# Patient Record
Sex: Female | Born: 1980 | Race: Black or African American | Hispanic: No | Marital: Single
Health system: Southern US, Community
[De-identification: ages and names within clinical notes are randomized; demographics above are authoritative.]

## PROBLEM LIST (undated history)

## (undated) DIAGNOSIS — F319 Bipolar disorder, unspecified: Secondary | ICD-10-CM

## (undated) HISTORY — DX: Bipolar disorder, unspecified: F31.9

---
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
## 2013-04-25 ENCOUNTER — Emergency Department: Payer: Self-pay | Admitting: Emergency Medicine

## 2013-06-28 ENCOUNTER — Emergency Department: Payer: Self-pay | Admitting: Emergency Medicine

## 2013-06-28 LAB — COMPREHENSIVE METABOLIC PANEL
ALT: 19 U/L (ref 12–78)
Albumin: 3.6 g/dL (ref 3.4–5.0)
Alkaline Phosphatase: 80 U/L
Anion Gap: 3 — ABNORMAL LOW (ref 7–16)
BUN: 14 mg/dL (ref 7–18)
Bilirubin,Total: 0.2 mg/dL (ref 0.2–1.0)
CHLORIDE: 104 mmol/L (ref 98–107)
CO2: 26 mmol/L (ref 21–32)
Calcium, Total: 9.9 mg/dL (ref 8.5–10.1)
Creatinine: 0.91 mg/dL (ref 0.60–1.30)
EGFR (African American): >60
EGFR (Non-African Amer.): >60
Glucose: 104 mg/dL — ABNORMAL HIGH (ref 65–99)
OSMOLALITY: 267 (ref 275–301)
Potassium: 4.5 mmol/L (ref 3.5–5.1)
SGOT(AST): 25 U/L (ref 15–37)
Sodium: 133 mmol/L — ABNORMAL LOW (ref 136–145)
TOTAL PROTEIN: 8.1 g/dL (ref 6.4–8.2)

## 2013-06-28 LAB — URINALYSIS, COMPLETE
BLOOD: NEGATIVE
Bilirubin,UR: NEGATIVE
Glucose,UR: NEGATIVE mg/dL (ref 0–75)
KETONE: NEGATIVE
LEUKOCYTE ESTERASE: NEGATIVE
Nitrite: NEGATIVE
PH: 7 (ref 4.5–8.0)
PROTEIN: NEGATIVE
Specific Gravity: 1.009 (ref 1.003–1.030)
WBC UR: 1 /HPF (ref 0–5)

## 2013-06-28 LAB — DRUG SCREEN, URINE

## 2013-06-28 LAB — CBC
HCT: 38 % (ref 35.0–47.0)
HGB: 12.9 g/dL (ref 12.0–16.0)
MCH: 29.3 pg (ref 26.0–34.0)
MCHC: 33.8 g/dL (ref 32.0–36.0)
MCV: 86 fL (ref 80–100)
Platelet: 282 10*3/uL (ref 150–440)
RBC: 4.4 10*6/uL (ref 3.80–5.20)
RDW: 16.6 % — ABNORMAL HIGH (ref 11.5–14.5)
WBC: 5.3 10*3/uL (ref 3.6–11.0)

## 2013-06-28 LAB — ETHANOL: Ethanol %: <0.003 % (ref 0.000–0.080)

## 2013-06-28 LAB — ACETAMINOPHEN LEVEL: Acetaminophen: <2 ug/mL

## 2013-06-28 LAB — SALICYLATE LEVEL: Salicylates, Serum: <1.7 mg/dL
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
## 2014-10-08 NOTE — Consult Note (Signed)
PATIENT NAME:  Monica Costa, Tameria MR#:  [id number] DATE OF BIRTH:  1981/01/28  DATE OF CONSULTATION:  06/29/2013  REFERRING PHYSICIAN:  Kathreen DevoidKevin A. Paduchowski, MD CONSULTING PHYSICIAN:  Ardeen FillersUzma S. Garnetta BuddyFaheem, MD  REASON FOR CONSULTATION: "I'm in a lot of pain."   HISTORY OF PRESENT ILLNESS: The patient is a 34 year old African American female who presented to the ED complaining of hurting all over. She reported that she is having headaches, "My eyes burn and my feet are freezing." She stated that she cannot take the pain and cannot deal with it anymore.   During my interview, the patient was noted to be sitting comfortably in the bed. She reported that she is currently living at the shelter since December 14th. Reported that she was having a conflict with the staff at the shelter and, since she was Caucasian lady, she asked her to leave. The patient reported that she feels that she is having some confusion and that her mind is not under her control. She reported that she never had a mental health assessment done in the past and she might be bipolar, however, she has never taken any medications. She reported that she wants to work and she wants to apply for different jobs. Reported that she was in seen at [facility] and they have prescribed her medications including naproxen, Fioricet and Percocet, but her main 2 medications are naproxen and Flexeril. Reported that she was last seen there in October and she has not taken any medications for the past couple of months. Reported that she is not having any thoughts to harm herself. She currently denied having any depressive symptoms. She denied having any suicidal or homicidal ideations or plans. 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 The patient appeared to be somewhat anxious and angry at the people at the shelter. However, she reported that she does not have any issues and  she can go back at the same place. She reported that she is not taking any medications on a consistent basis.   PAST PSYCHIATRIC HISTORY: The patient reported that she does not have any history of mental illness and has never tried to hurt herself. Reported that she has never been evaluated by a psychiatrist in the past. She does not take any psychotropic medications. Reported that she has only been seen by the [facility] and different area hospitals including in [place] well as in [place] and in [place]. She denies having any current issues. She reported that she has been is living in the shelter since December. Reported that before that she was living with her family members. She has never attempted suicide.   SUBSTANCE ABUSE HISTORY: The patient denied using any drugs or alcohol at this time. She stated that she does not smoke   ALLERGIES: No known drug allergies.   CURRENT MEDICATIONS: None.   VITAL SIGNS: Temperature 98.4, pulse 78, respirations 18, blood pressure 131/71.   REVIEW OF SYSTEMS: CONSTITUTIONAL: The patient denies any fever or chills. No weight changes.  EYES: She initially complained of burning in her eyes but currently she denied having any burning or irritation.  RESPIRATORY: No shortness of breath or cough.  CARDIOVASCULAR: Denies any chest pain or orthopnea.  GASTROINTESTINAL: No abdominal pain, nausea, vomiting or diarrhea.  GENITOURINARY: No incontinence or frequency.  ENDOCRINE: No heat or cold intolerance.  LYMPHATIC: No anemia or easy bruising.  INTEGUMENTARY: No acne or rash.  MUSCULOSKELETAL: Denies any muscle or joint pain.   MENTAL STATUS EXAMINATION: The patient is a moderately-built female who appeared her stated age. She was somewhat anxious. Her mood was fine. Affect was anxious. Thought process was circumstantial. Thought content was nondelusional. She currently denied having any suicidal or homicidal ideations or plans. She denied having any perceptual  disturbances.   DIAGNOSTIC IMPRESSION: AXIS I: Mood disorder.  AXIS II: None.  AXIS III: History of pain.   TREATMENT PLAN:  The patient came to the ED on a voluntary basis as she was having some pain, but she currently denied having any acute symptoms. She reported that she can go back to the shelter and does not need any psychotropic medications. She was also evaluated by behavioral health staff and they also agreed that the patient does not need any medications at this time. She will be referred to the [facility] for continuity of care. I advised the patient to come back if she noticed worsening of her symptoms and she demonstrated understanding. No medications will be dispensed to the patient at this time.   Thank you for allowing me to participate in the care of this patient.   ____________________________ Ardeen Fillers. Garnetta Buddy, MD usf:cs D: 06/29/2013 13:57:19 ET T: 06/29/2013 14:10:00 ET JOB#: [id number]  cc: Ardeen Fillers. Garnetta Buddy, MD, <Dictator> Rhunette Croft MD ELECTRONICALLY SIGNED 07/01/2013 9:07

## 2020-02-13 ENCOUNTER — Emergency Department
Admission: EM | Admit: 2020-02-13 | Discharge: 2020-02-13 | Disposition: A | Discharge status: Other Acute Inpatient Hospital | Payer: Medicare Other | Attending: Emergency Medicine | Admitting: Emergency Medicine

## 2020-02-13 DIAGNOSIS — F3010 Manic episode without psychotic symptoms, unspecified: Secondary | ICD-10-CM

## 2020-02-13 DIAGNOSIS — Z008 Encounter for other general examination: Secondary | ICD-10-CM

## 2020-02-13 DIAGNOSIS — Z20822 Contact with and (suspected) exposure to covid-19: Secondary | ICD-10-CM | POA: Insufficient documentation

## 2020-02-13 DIAGNOSIS — F23 Brief psychotic disorder: Secondary | ICD-10-CM | POA: Insufficient documentation

## 2020-02-13 DIAGNOSIS — F309 Manic episode, unspecified: Secondary | ICD-10-CM | POA: Insufficient documentation

## 2020-02-13 LAB — CBC AND DIFFERENTIAL
Absolute NRBC: 0 10*3/uL (ref 0.00–0.00)
Basophils Absolute Automated: 0.03 10*3/uL (ref 0.00–0.08)
Basophils Automated: 0.3 %
Eosinophils Absolute Automated: 0.11 10*3/uL (ref 0.00–0.44)
Eosinophils Automated: 1 %
Hematocrit: 37.4 % (ref 34.7–43.7)
Hgb: 12.7 g/dL (ref 11.4–14.8)
Immature Granulocytes Absolute: 0.05 10*3/uL (ref 0.00–0.07)
Immature Granulocytes: 0.5 %
Lymphocytes Absolute Automated: 2.26 10*3/uL (ref 0.42–3.22)
Lymphocytes Automated: 20.8 %
MCH: 29.4 pg (ref 25.1–33.5)
MCHC: 34 g/dL (ref 31.5–35.8)
MCV: 86.6 fL (ref 78.0–96.0)
MPV: 10.7 fL (ref 8.9–12.5)
Monocytes Absolute Automated: 0.58 10*3/uL (ref 0.21–0.85)
Monocytes: 5.3 %
Neutrophils Absolute: 7.85 10*3/uL — ABNORMAL HIGH (ref 1.10–6.33)
Neutrophils: 72.1 %
Nucleated RBC: 0 /100 WBC (ref 0.0–0.0)
Platelets: 378 10*3/uL — ABNORMAL HIGH (ref 142–346)
RBC: 4.32 10*6/uL (ref 3.90–5.10)
RDW: 16 % — ABNORMAL HIGH (ref 11–15)
WBC: 10.88 10*3/uL — ABNORMAL HIGH (ref 3.10–9.50)

## 2020-02-13 LAB — COMPREHENSIVE METABOLIC PANEL
ALT: 10 U/L (ref 0–55)
AST (SGOT): 14 U/L (ref 5–34)
Albumin/Globulin Ratio: 0.8 — ABNORMAL LOW (ref 0.9–2.2)
Albumin: 3.1 g/dL — ABNORMAL LOW (ref 3.5–5.0)
Alkaline Phosphatase: 70 U/L (ref 37–106)
Anion Gap: 13 (ref 5.0–15.0)
BUN: 8 mg/dL (ref 7.0–19.0)
Bilirubin, Total: 0.3 mg/dL (ref 0.2–1.2)
CO2: 19 mEq/L — ABNORMAL LOW (ref 22–29)
Calcium: 8.9 mg/dL (ref 8.5–10.5)
Chloride: 106 mEq/L (ref 100–111)
Creatinine: 1 mg/dL (ref 0.6–1.0)
Globulin: 3.9 g/dL — ABNORMAL HIGH (ref 2.0–3.6)
Glucose: 146 mg/dL — ABNORMAL HIGH (ref 70–100)
Potassium: 3.4 mEq/L — ABNORMAL LOW (ref 3.5–5.1)
Protein, Total: 7 g/dL (ref 6.0–8.3)
Sodium: 138 mEq/L (ref 136–145)

## 2020-02-13 LAB — HEMOLYSIS INDEX: Hemolysis Index: 0 (ref 0–18)

## 2020-02-13 LAB — ACETAMINOPHEN LEVEL: Acetaminophen Level: <7 ug/mL — ABNORMAL LOW (ref 10–30)

## 2020-02-13 LAB — ECG 12-LEAD
Atrial Rate: 115 {beats}/min
P Axis: 39 degrees
P-R Interval: 138 ms
Q-T Interval: 312 ms
QRS Duration: 60 ms
QTC Calculation (Bezet): 431 ms
R Axis: 9 degrees
T Axis: 8 degrees
Ventricular Rate: 115 {beats}/min

## 2020-02-13 LAB — SALICYLATE LEVEL: Salicylate Level: <5 mg/dL — ABNORMAL LOW (ref 15.0–30.0)

## 2020-02-13 LAB — COVID-19 (SARS-COV-2): SARS CoV 2 Overall Result: NEGATIVE

## 2020-02-13 LAB — URINALYSIS REFLEX TO MICROSCOPIC EXAM - REFLEX TO CULTURE
Bilirubin, UA: NEGATIVE
Blood, UA: NEGATIVE
Glucose, UA: NEGATIVE
Ketones UA: NEGATIVE
Leukocyte Esterase, UA: NEGATIVE
Nitrite, UA: NEGATIVE
Protein, UR: NEGATIVE
Specific Gravity UA: 1.011 (ref 1.001–1.035)
Urine pH: 6 (ref 5.0–8.0)
Urobilinogen, UA: NEGATIVE mg/dL (ref 0.2–2.0)

## 2020-02-13 LAB — RAPID DRUG SCREEN, URINE
Barbiturate Screen, UR: NEGATIVE
Benzodiazepine Screen, UR: NEGATIVE
Cannabinoid Screen, UR: POSITIVE — AB
Cocaine, UR: NEGATIVE
Opiate Screen, UR: NEGATIVE
PCP Screen, UR: NEGATIVE
Urine Amphetamine Screen: NEGATIVE

## 2020-02-13 LAB — HCG QUANTITATIVE: hCG, Quant.: <1.2

## 2020-02-13 LAB — GFR: EGFR: >60

## 2020-02-13 LAB — ETHANOL: Alcohol: NOT DETECTED mg/dL

## 2020-02-13 MED ORDER — LORAZEPAM 2 MG/ML IJ SOLN
4.0000 mg | Freq: Once | INTRAMUSCULAR | Status: AC
Start: 2020-02-13 — End: 2020-02-13
  Administered 2020-02-13: 02:00:00 4 mg via INTRAMUSCULAR
  Filled 2020-02-13: qty 2

## 2020-02-13 MED ORDER — POTASSIUM CHLORIDE CRYS ER 20 MEQ PO TBCR
40.0000 meq | EXTENDED_RELEASE_TABLET | Freq: Once | ORAL | Status: DC
Start: 2020-02-13 — End: 2020-02-13

## 2020-02-13 MED ORDER — HALOPERIDOL LACTATE 5 MG/ML IJ SOLN
10.0000 mg | Freq: Once | INTRAMUSCULAR | Status: AC
Start: 2020-02-13 — End: 2020-02-13
  Administered 2020-02-13: 02:00:00 10 mg via INTRAMUSCULAR
  Filled 2020-02-13: qty 2

## 2020-02-13 MED ORDER — DIPHENHYDRAMINE HCL 50 MG/ML IJ SOLN
50.0000 mg | Freq: Once | INTRAMUSCULAR | Status: AC
Start: 2020-02-13 — End: 2020-02-13
  Administered 2020-02-13: 02:00:00 50 mg via INTRAMUSCULAR
  Filled 2020-02-13: qty 1

## 2020-02-13 MED ORDER — KETAMINE HCL 50 MG/ML IJ SOLN
2.0000 mg/kg | Freq: Once | INTRAMUSCULAR | Status: DC | PRN
Start: 2020-02-13 — End: 2020-02-13

## 2020-02-13 NOTE — ED Notes (Signed)
Patient is resting comfortably. 
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
## 2020-02-13 NOTE — ED Notes (Addendum)
This RN explained plan of care to patient. IM medications administered, no manual restraining needed, patient appearing compliant at this time.

## 2020-02-13 NOTE — ED Provider Notes (Signed)
EMERGENCY DEPARTMENT HISTORY AND PHYSICAL EXAM     Physician/Midlevel provider first contact with patient: 02/13/20 0118         Date: 02/13/2020  Patient Name: Donna Kline    This note was generated by the Epic EMR system/ Dragon speech recognition and may contain inherent errors or omissions not intended by the user. Grammatical errors, random word insertions, deletions and pronoun errors  are occasional consequences of this technology due to software limitations. Not all errors are caught or corrected. If there are questions or concerns about the content of this note or information contained within the body of this dictation they should be addressed directly with the author for clarification.    History of Presenting Illness     Chief Complaint   Patient presents with    Psychiatric Evaluation       History Provided By: Patient, EMS and [employer]    Chief Complaint: Psychotic behavior  Onset: This evening  Timing: Constant  Location: Psychiatric  Quality: Manic  Radiation: none  Severity: Moderate to severe  Exacerbating factors: None  Alleviating factors: None  Associated Symptoms: Agitation  Pertinent Negatives: see ROS    Additional History: Donna Kline is a 39 y.o. female with history of bipolar disorder BIBA due to psychotic behavior in the field.  EMS was called by family, who reported manic behavior this evening.  Patient was aggressive with EMS and police and claimed to be a deity in the field.  She received ketamine 100 mg IM and Haldol 5 mg IM PTA.  On arrival patient awake but only intermittently cooperative.  No further information is available at this time.  Patient arrives under ECO.    I reviewed patient's last ED visit, clinic visit or admission/discharge summary, as well as associated recent EKGs, lab or imaging results, if applicable.     PCP: No primary care provider on file.    Past History   Past Medical History--reviewed, as per HPI  Past Surgical History--reviewed, noncontributory  Family  History--reviewed, noncontributory  Social History--reviewed, noncontributory  Allergies reviewed and documented as pertinent to the case.     Review of Systems     Review of Systems   Constitutional: Negative for chills and fever.   HENT: Negative for congestion, sore throat and voice change.    Eyes: Negative for redness and visual disturbance.   Respiratory: Negative for cough, chest tightness and shortness of breath.    Cardiovascular: Negative for chest pain, palpitations and leg swelling.   Gastrointestinal: Negative for abdominal pain, nausea and vomiting.   Endocrine: Negative for polydipsia and polyphagia.   Genitourinary: Negative for dysuria, flank pain, frequency, urgency and vaginal bleeding.   Musculoskeletal: Negative for back pain, gait problem, neck pain and neck stiffness.   Skin: Negative for color change and rash.   Allergic/Immunologic: Negative for immunocompromised state.   Neurological: Negative for dizziness, syncope, weakness, numbness and headaches.   Hematological: Negative for adenopathy.   Psychiatric/Behavioral: Positive for agitation and behavioral problems. Negative for confusion, hallucinations and suicidal ideas. The patient is not nervous/anxious.         + Manic behavior   All other systems reviewed and are negative.      Other than pertinent positives as above, a complete ROS was reviewed and negative.    Physical Exam   BP 166/77    Pulse 96    Temp 98.6 F (37 C) (Oral)    Resp 20    Ht 5'  2" (1.575 m)    Wt 113.4 kg    SpO2 98%    BMI 45.73 kg/m     Physical Exam  Vitals and nursing note reviewed.   Constitutional:       Appearance: She is well-developed.      Comments: Awake and alert.  Intermittently agitated, sometimes redirectable.  BMI> 45.   HENT:      Head: Normocephalic and atraumatic.      Nose: Nose normal.   Eyes:      Conjunctiva/sclera: Conjunctivae normal.      Pupils: Pupils are equal, round, and reactive to light.   Neck:      Vascular: No JVD.    Cardiovascular:      Rate and Rhythm: Normal rate and regular rhythm.      Pulses: Normal pulses.      Heart sounds: Normal heart sounds.      Comments: Regular pulses, palpable in all 4 extremities. No pedal edema.   Pulmonary:      Effort: Pulmonary effort is normal. No respiratory distress.      Breath sounds: Normal breath sounds.      Comments: Normal work of breathing, clear lungs, no tachypnea, no respiratory distress.  Abdominal:      General: Bowel sounds are normal. There is no distension.      Palpations: Abdomen is soft.      Tenderness: There is no abdominal tenderness.      Comments: Abdomen soft, NTND, NABS. No guarding, rebound or rigidity. No palpable masses or hernias.    Musculoskeletal:         General: No swelling or tenderness. Normal range of motion.      Cervical back: Normal range of motion and neck supple.   Skin:     General: Skin is warm and dry.      Findings: No rash.   Neurological:      General: No focal deficit present.      Mental Status: She is oriented to Kline, place, and time.      Motor: No abnormal muscle tone.      Comments: Awake and alert.  Moves all 4 extremities.  Grossly nonfocal neuro exam.   Psychiatric:      Comments: Intermittently agitated and sometimes redirectable.  Exhibiting manic behavior.           Diagnostic Study Results     Labs -  Reviewed, if relevant to the case.    Radiologic Studies -   Reviewed, if relevant to the case.      Medical Decision Making   I am the first provider for this patient.    I reviewed the vital signs, available nursing notes, past medical history, past surgical history, family history and social history.    Vital Signs: Reviewed the patient's vital signs during ED stay.    I reviewed pt's pulse oxymetry and cardiac monitor values, as relevant to the case.    Pulse Oximetry Analysis: 98% on RA- Normal    Cardiac Monitor:  Rhythm:  Sinus Tachycardia, Rate:  Tachycardic, Ectopy:  None    Personal Protective Equipment  (PPE)  Gloves, N95 and surgical mask.    Record Review: The following, if applicable to the case, were reviewed and noted:    Old medical records.  Nursing notes.  Outside records.     EKG:  Interpreted by this Emergency Physician.   Time Interpreted: 0127   Rate: 115   Rhythm:  Sinus Tachycardia    Interpretation: QRS 60, QTc 431, no ST elevations or depressions   Comparison: No prior study is available for comparison.    Critical Care Time:   CRITICAL CARE: The high probability of sudden, clinically significant deterioration in the patient's condition required the highest level of my preparedness to intervene urgently.    The services I provided to this patient were to treat and/or prevent clinically significant deterioration that could result in: death.  Services included the following: chart data review, reviewing nursing notes and/or old charts, documentation time, consultant collaboration regarding findings and treatment options, medication orders and management, direct patient care, re-evaluations, vital sign assessments and ordering, interpreting and reviewing diagnostic studies/lab tests.    Aggregate critical care time was 37 minutes, which includes only time during which I was engaged in work directly related to the patient's care, as described above, whether at the bedside or elsewhere in the Emergency Department.  It did not include time spent performing other reported procedures or the services of residents, students, nurses or physician assistants.      ED Course:     69: Spoke with [facility], who requested to evaluate patient before we sedate her for I&O to get urine for medical clearance.     0209: [facility] will proceed with TDO.     0340: Patient is medically cleared.     1000: Bed search is still in progress.  Patient signed out to the ED Dr. Dory Peru, who will follow up on the progress of placement for this patient.    Provider Notes/MDM:     39 y.o. female with history of bipolar BIBA and accompanied by [employer]  due to psychotic and manic behavior in the field.  She received Haldol and ketamine PTA in the field due to verbally aggressive behavior towards [employer].  On arrival patient tachycardic and hypertensive, good sats on RA.  Exam as above.  Patient arrived under ECO.  During ED stay patient required chemical restraints in order to accomplish medical clearance (IM Ativan, Benadryl, Haldol and ketamine), after which patient was closely monitored and frequently reassessed.  EKG without ischemic changes.  Labwork notable for UDS positive for cannabis and mild hypokalemia on CMP, otherwise reassuring.  Patient evaluated by [facility] and is now TDO.  Bed search in progress.  Patient signed out to the incoming ED MD, who will follow up on the progress of psychiatric hospital placement.  Patient remains HDS      Diagnosis     Clinical Impression:   1. Manic behavior    2. Acute psychosis        Disposition:   ED Disposition     ED Disposition Condition Date/Time Comment    Transfer to Another Facility  Sun Feb 13, 2020  2:27 AM Donna Kline should be transferred out to a psychiatric facility, location TBD.          The above diagnostic process was due to medical necessity based on risk stratification of potential harm of patient's presenting complaint.     Attestations: This note is prepared by Ardis Hughs, MD, PHD, FACEP.      Juliane Poot, MD PhD  02/13/20 [phone]

## 2020-02-13 NOTE — ED Notes (Signed)
Patient refusing to give urine sample after explantion stating "I am a god, woman, and man, I complete me."

## 2020-02-13 NOTE — ED Notes (Signed)
[employer] at the bedside

## 2020-02-13 NOTE — ED Notes (Addendum)
Patient refusing to get into green gown at this time. Bilateral wrist handcuffs in place.

## 2020-02-13 NOTE — ED Notes (Signed)
Patient cooperative at this time, agrees to try and urinate on bedside commode. Urine collected and sent, patient back in bed, left wrist in cuff.

## 2020-02-13 NOTE — ED Notes (Signed)
Patient refusing to give urine at this time.

## 2020-02-14 ENCOUNTER — Encounter (HOSPITAL_BASED_OUTPATIENT_CLINIC_OR_DEPARTMENT_OTHER): Payer: Self-pay

## 2020-02-14 NOTE — Progress Notes (Signed)
Pt was admitted to [facility] on 8/29 at 1557 under a TDO. Accepting information given directly from CSB to ED.

## 2020-06-27 ENCOUNTER — Emergency Department
Admission: EM | Admit: 2020-06-27 | Discharge: 2020-06-28 | Disposition: A | Discharge status: Other Acute Inpatient Hospital | Payer: Medicare Other | Attending: Emergency Medicine | Admitting: Emergency Medicine

## 2020-06-27 DIAGNOSIS — F29 Unspecified psychosis not due to a substance or known physiological condition: Secondary | ICD-10-CM | POA: Insufficient documentation

## 2020-06-27 DIAGNOSIS — Z20822 Contact with and (suspected) exposure to covid-19: Secondary | ICD-10-CM | POA: Insufficient documentation

## 2020-06-27 DIAGNOSIS — R451 Restlessness and agitation: Secondary | ICD-10-CM | POA: Insufficient documentation

## 2020-06-27 MED ORDER — LORAZEPAM 1 MG PO TABS
1.0000 mg | ORAL_TABLET | Freq: Once | ORAL | Status: AC
Start: 2020-06-27 — End: 2020-06-27
  Administered 2020-06-27: 21:00:00 1 mg via ORAL
  Filled 2020-06-27: qty 1

## 2020-06-27 MED ORDER — ACETAMINOPHEN 500 MG PO TABS
1000.0000 mg | ORAL_TABLET | Freq: Once | ORAL | Status: AC
Start: 2020-06-27 — End: 2020-06-27
  Administered 2020-06-27: 21:00:00 1000 mg via ORAL
  Filled 2020-06-27: qty 2

## 2020-06-27 MED ORDER — HALOPERIDOL LACTATE 5 MG/ML IJ SOLN
5.0000 mg | Freq: Once | INTRAMUSCULAR | Status: AC | PRN
Start: 2020-06-27 — End: 2020-06-28
  Administered 2020-06-28: 07:00:00 5 mg via INTRAMUSCULAR
  Filled 2020-06-27: qty 1
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
## 2020-06-27 NOTE — ED Notes (Signed)
Pt refusing lab work and ekg at this time. Provider aware

## 2020-06-27 NOTE — ED Notes (Signed)
Bed: BL22  Expected date:   Expected time:   Means of arrival:   Comments:  Medic 206

## 2020-06-28 ENCOUNTER — Inpatient Hospital Stay
Admission: AD | Admit: 2020-06-28 | Discharge: 2020-07-05 | DRG: 885 | Disposition: A | Discharge status: Home / Self Care | Payer: PRIVATE HEALTH INSURANCE | Attending: Psychiatry | Admitting: Psychiatry

## 2020-06-28 DIAGNOSIS — F29 Unspecified psychosis not due to a substance or known physiological condition: Secondary | ICD-10-CM | POA: Diagnosis present

## 2020-06-28 DIAGNOSIS — F17210 Nicotine dependence, cigarettes, uncomplicated: Secondary | ICD-10-CM | POA: Diagnosis present

## 2020-06-28 DIAGNOSIS — F17200 Nicotine dependence, unspecified, uncomplicated: Secondary | ICD-10-CM

## 2020-06-28 DIAGNOSIS — F1210 Cannabis abuse, uncomplicated: Secondary | ICD-10-CM | POA: Diagnosis present

## 2020-06-28 DIAGNOSIS — Z008 Encounter for other general examination: Secondary | ICD-10-CM

## 2020-06-28 DIAGNOSIS — F250 Schizoaffective disorder, bipolar type: Principal | ICD-10-CM | POA: Diagnosis present

## 2020-06-28 DIAGNOSIS — I10 Essential (primary) hypertension: Secondary | ICD-10-CM | POA: Diagnosis present

## 2020-06-28 DIAGNOSIS — F1010 Alcohol abuse, uncomplicated: Secondary | ICD-10-CM | POA: Diagnosis present

## 2020-06-28 LAB — CBC AND DIFFERENTIAL
Absolute NRBC: 0 10*3/uL (ref 0.00–0.00)
Basophils Absolute Automated: 0.02 10*3/uL (ref 0.00–0.08)
Basophils Automated: 0.2 %
Eosinophils Absolute Automated: 0.01 10*3/uL (ref 0.00–0.44)
Eosinophils Automated: 0.1 %
Hematocrit: 38.5 % (ref 34.7–43.7)
Hgb: 12.8 g/dL (ref 11.4–14.8)
Immature Granulocytes Absolute: 0.05 10*3/uL (ref 0.00–0.07)
Immature Granulocytes: 0.5 %
Lymphocytes Absolute Automated: 1.57 10*3/uL (ref 0.42–3.22)
Lymphocytes Automated: 15.5 %
MCH: 27.6 pg (ref 25.1–33.5)
MCHC: 33.2 g/dL (ref 31.5–35.8)
MCV: 83.2 fL (ref 78.0–96.0)
MPV: 9.8 fL (ref 8.9–12.5)
Monocytes Absolute Automated: 0.65 10*3/uL (ref 0.21–0.85)
Monocytes: 6.4 %
Neutrophils Absolute: 7.85 10*3/uL — ABNORMAL HIGH (ref 1.10–6.33)
Neutrophils: 77.3 %
Nucleated RBC: 0 /100 WBC (ref 0.0–0.0)
Platelets: 392 10*3/uL — ABNORMAL HIGH (ref 142–346)
RBC: 4.63 10*6/uL (ref 3.90–5.10)
RDW: 16 % — ABNORMAL HIGH (ref 11–15)
WBC: 10.15 10*3/uL — ABNORMAL HIGH (ref 3.10–9.50)

## 2020-06-28 LAB — RAPID DRUG SCREEN, URINE
Barbiturate Screen, UR: NEGATIVE
Benzodiazepine Screen, UR: NEGATIVE
Cannabinoid Screen, UR: POSITIVE — AB
Cocaine, UR: NEGATIVE
Opiate Screen, UR: NEGATIVE
PCP Screen, UR: NEGATIVE
Urine Amphetamine Screen: NEGATIVE

## 2020-06-28 LAB — COMPREHENSIVE METABOLIC PANEL
ALT: 6 U/L (ref 0–55)
AST (SGOT): 16 U/L (ref 5–34)
Albumin/Globulin Ratio: 0.8 — ABNORMAL LOW (ref 0.9–2.2)
Albumin: 3.3 g/dL — ABNORMAL LOW (ref 3.5–5.0)
Alkaline Phosphatase: 73 U/L (ref 37–106)
Anion Gap: 9 (ref 5.0–15.0)
BUN: 3 mg/dL — ABNORMAL LOW (ref 7.0–19.0)
Bilirubin, Total: 0.5 mg/dL (ref 0.2–1.2)
CO2: 23 mEq/L (ref 22–29)
Calcium: 8.9 mg/dL (ref 8.5–10.5)
Chloride: 105 mEq/L (ref 100–111)
Creatinine: 0.8 mg/dL (ref 0.6–1.0)
Globulin: 4 g/dL — ABNORMAL HIGH (ref 2.0–3.6)
Glucose: 111 mg/dL — ABNORMAL HIGH (ref 70–100)
Potassium: 3.9 mEq/L (ref 3.5–5.1)
Protein, Total: 7.3 g/dL (ref 6.0–8.3)
Sodium: 137 mEq/L (ref 136–145)

## 2020-06-28 LAB — URINALYSIS REFLEX TO MICROSCOPIC EXAM - REFLEX TO CULTURE
Bilirubin, UA: NEGATIVE
Glucose, UA: NEGATIVE
Leukocyte Esterase, UA: NEGATIVE
Nitrite, UA: NEGATIVE
Protein, UR: 30 — AB
Specific Gravity UA: 1.012 (ref 1.001–1.035)
Urine pH: 6 (ref 5.0–8.0)
Urobilinogen, UA: NEGATIVE mg/dL (ref 0.2–2.0)

## 2020-06-28 LAB — HCG QUANTITATIVE: hCG, Quant.: <1.2

## 2020-06-28 LAB — ETHANOL: Alcohol: NOT DETECTED mg/dL

## 2020-06-28 LAB — COVID-19 (SARS-COV-2): SARS CoV 2 Overall Result: NEGATIVE

## 2020-06-28 LAB — GFR: EGFR: >60

## 2020-06-28 LAB — SALICYLATE LEVEL: Salicylate Level: <5 mg/dL — ABNORMAL LOW (ref 15.0–30.0)

## 2020-06-28 LAB — ACETAMINOPHEN LEVEL: Acetaminophen Level: <7 ug/mL — ABNORMAL LOW (ref 10–30)

## 2020-06-28 LAB — HEMOLYSIS INDEX: Hemolysis Index: 6 (ref 0–18)

## 2020-06-28 MED ORDER — RISPERIDONE 1 MG PO TABS
2.0000 mg | ORAL_TABLET | Freq: Two times a day (BID) | ORAL | Status: DC
Start: 2020-06-28 — End: 2020-07-01
  Administered 2020-06-28 – 2020-07-01 (×6): 2 mg via ORAL
  Filled 2020-06-28 (×6): qty 2

## 2020-06-28 MED ORDER — CLONAZEPAM 0.5 MG PO TABS
0.5000 mg | ORAL_TABLET | ORAL | Status: DC | PRN
Start: 2020-06-28 — End: 2020-07-05
  Administered 2020-06-28 – 2020-07-04 (×5): 0.5 mg via ORAL
  Filled 2020-06-28 (×5): qty 1

## 2020-06-28 MED ORDER — AMLODIPINE BESYLATE 5 MG PO TABS
5.0000 mg | ORAL_TABLET | Freq: Every day | ORAL | Status: DC
Start: 2020-06-28 — End: 2020-07-01
  Administered 2020-06-28 – 2020-07-01 (×4): 5 mg via ORAL
  Filled 2020-06-28 (×4): qty 1

## 2020-06-28 MED ORDER — OLANZAPINE 10 MG PO TABS
10.0000 mg | ORAL_TABLET | Freq: Two times a day (BID) | ORAL | Status: DC | PRN
Start: 2020-06-28 — End: 2020-07-05

## 2020-06-28 NOTE — ED Notes (Signed)
This RN rounded on patient, patient appears calm, resting comfortably in room. [employer] at bedside.

## 2020-06-28 NOTE — ED Notes (Signed)
Wt spoke with ER regarding Covid test again- test can be forced on pt.

## 2020-06-28 NOTE — Progress Notes (Signed)
Mental Health Therapy Progress Note  Patient has not attended any groups thus far today.  Will continue to encourage group attendance and participation in order to increase positive coping skills and express feelings regarding current situation.

## 2020-06-28 NOTE — ED Notes (Signed)
Pt has Medicare and Medicaid. No auth needed.
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
## 2020-06-28 NOTE — Progress Notes (Signed)
Worker scanned in the TDO paperwork into Media.

## 2020-06-28 NOTE — Plan of Care (Signed)
Problem: At risk for harm to others AS EVIDENCED BY...  Goal: Will identify long and short term goals based on individual needs and strengths  Outcome: Progressing  Goal: Will report reduction of thoughts to harm others and no attempts  Outcome: Progressing  Goal: Identifies triggers and protective strategies  Outcome: Progressing  Goal: Verbalizes understanding of medication, benefits, and side effects  Outcome: Progressing  Goal: Identify and participate in supportive program therapies  Outcome: Progressing    Donna Kline was laying in a bed when she was received. She is Anxious, irritable and selectively cooperative. Her speech is pressured and loud, her mood is depressed and affect is constricted. She is delusional but her thought process is logical and coherent. She is A+O x 2; disoriented to Time and situation. Pt gets quickly frustrated and irritable when she was asked assessment questions. She denies SI/HI/AVH/SIB and Pain. Pt is med compliant and she made her needs known. Her BP was elevated and Pt was given scheduled Amlodipine 5 mg and KlonoPin 0.5 mg for anxiety. She also had her scheduled Risperdal 2 mg. Pt is isolative to her room and remained sleeping most of the night. Her gait is stable and can perform ADLs independently. Pt is calm and resting without distress. No aggressive behavior so far and staff will continue to monitor Q 15 min for safety.

## 2020-06-28 NOTE — ED Notes (Signed)
Pt still refusing labs and ekg at this time.

## 2020-06-28 NOTE — ED Notes (Signed)
Wt informed that all labs are in process

## 2020-06-28 NOTE — Nursing Progress Note (Signed)
Checklist instructions: Score the patient once a shift and as needed. Absence of a behavior results in a score of 0. Presence or increase in the display of a behavior results in a score of 1. Maximum score (SUM) possible is 6. If a behavior is at baseline for a patient, e.g. the patient is normally confused, this will result in a score of 0. An increase in confusion will result in a score of 1.      Donna Kline is being assessed under the Broset Violence Checklist for any escalations in potentially violent or harmful behavior.     Date of Admission:                                                                         Date of Assessment: 06/28/20   Assessment Time 0900      Confused 1      Irritable 0      Boisterous 0      Verbal Threats 0      Physical Threats 0      Attacking Objects 0      SUM 1          TW attempted the following interventions based on the patient's sum total score assessed on the BVC: 1= Monitoring.      Should the patient require multiple interventions or fails to engage in de-escalation with staff, please review the plan for managing the care of an escalated patient.  Please do the following:  1.  Immediately notify Security and Nursing Administrative Supervisor/Director to respond  2.  Conduct team huddle to rule out clinical causes for escalated behavior  3.  Review the note authored by the Associate Professor for additional information  4.  Document specific behaviors observed  5.  Consider SAFE Team call for additional resources              The Broset Violence Checklist (BVC) is a 6 item inventory that was designed to assist in the prediction of imminent violent behavior (24 hours perspective) in healthcare and other sectors where workplace violence is acknowledged as a serious problem.   The BVC does not give instructions on what to do or what interventions to provide when a violent episode occurs; it is a took meant to aid in the risk assessment process. Interventions are based on the  individual, the environment, and cultural sensitivity indicators.

## 2020-06-28 NOTE — Progress Notes (Signed)
Attempted admission H&P.  Pt was initially cooperative with physical exam but didn't want to answer health questions.  When listening to her lungs, noted both lungs were clear to auscultation with no abnormal sounds.  When attempted to assess eyes, pt did not follow directions and started rambling and becoming agitated. When attempted to listen to her heart and abd, pt started rambling more and was very disorganized, talking fast and was illogical. Not following directions and starting to become agitated.  Unable to complete the admission H&P.  Discussed with pt's nurse.  She will discuss with psych team regarding medication for psychosis and agitation.  -Clarene Reamer, NP-C

## 2020-06-28 NOTE — ED Notes (Addendum)
Wt spoke with ER via Secure Chat about obtaining Covid swab from pt although she is declining all labs at this point. Covid can be obtained involuntarily.     4: Secure Chat with RN reports no luck with covid swab yet
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
## 2020-06-28 NOTE — ED Notes (Signed)
This RN rounded on patient, patient given breakfast tray and water. Patient alert, awake, appears calm, corporative at this time. VS reassessed.

## 2020-06-28 NOTE — Progress Notes (Addendum)
4098 TW attempted to contact Dr. Suezanne Jacquet [facility] on call). Generic voicemail was left.     1191 TW staffed pt with Dr. Lyman Bishop ([facility] on call) whom accepted the pt into his service. [facility] Robert notified.     4782 TW attempted to update [facility] unit, however was informed by RN Tobi Bastos to call back in 15 minutes.     0820 TW attempted to update [facility] unit again, no answer. [facility] and the ED have not been updated about acceptance.     0830 TW updated Tobi Bastos [facility]).    9562 TW updated Dr. Pattricia Boss and ED RN Delorise Shiner via epic chat.    [phone] [facility]) updated.

## 2020-06-28 NOTE — Progress Notes (Signed)
Checklist instructions: Score the patient once a shift and as needed. Absence of a behavior results in a score of 0. Presence or increase in the display of a behavior results in a score of 1. Maximum score (SUM) possible is 6. If a behavior is at baseline for a patient, e.g. the patient is normally confused, this will result in a score of 0. An increase in confusion will result in a score of 1.      Kristine Westerfield is being assessed under the Broset Violence Checklist for any escalations in potentially violent or harmful behavior.     Date of Admission:                         06/28/20                                                Date of Assessment:    Assessment Time 2000      Confused 0      Irritable 0      Boisterous 0      Verbal Threats 0      Physical Threats 0      Attacking Objects 0      SUM 0          TW attempted the following interventions based on the patient's sum total score assessed on the BVC: 1= Monitoring.    Should the patient require multiple interventions or fails to engage in de-escalation with staff, please review the plan for managing the care of an escalated patient.  Please do the following:  1.  Immediately notify Security and Nursing Administrative Supervisor/Director to respond  2.  Conduct team huddle to rule out clinical causes for escalated behavior  3.  Review the note authored by the Associate Professor for additional information  4.  Document specific behaviors observed  5.  Consider SAFE Team call for additional resources      The Broset Violence Checklist (BVC) is a 6 item inventory that was designed to assist in the prediction of imminent violent behavior (24 hours perspective) in healthcare and other sectors where workplace violence is acknowledged as a serious problem.   The BVC does not give instructions on what to do or what interventions to provide when a violent episode occurs; it is a took meant to aid in the risk assessment process. Interventions are based on the  individual, the environment, and cultural sensitivity indicators.

## 2020-06-28 NOTE — Progress Notes (Signed)
RN says the pt. Is disoriented at this time and can't complete this worker's assessment. Worker will attempt to complete the assessment with the pt. At another time. Her TDO hearing is scheduled for 06/30/2020.

## 2020-06-28 NOTE — ED Notes (Signed)
Informed by [facility] that covid swab could be involuntarily obtained.  During this process pt became verbally aggressive and continued to escalate.  At this time, parameters were met to medicate pt with haldol.  IM haldol given and pt consented to blood draw and ekg.

## 2020-06-29 LAB — LIPID PANEL
Cholesterol / HDL Ratio: 4.8
Cholesterol: 221 mg/dL — ABNORMAL HIGH (ref 0–199)
HDL: 46 mg/dL (ref 40–9999)
LDL Calculated: 157 mg/dL — ABNORMAL HIGH (ref 0–99)
Triglycerides: 92 mg/dL (ref 34–149)
VLDL Calculated: 18 mg/dL (ref 10–40)

## 2020-06-29 LAB — HEMOGLOBIN A1C
Average Estimated Glucose: 105.4 mg/dL
Hemoglobin A1C: 5.3 % (ref 4.6–5.9)

## 2020-06-29 MED ORDER — NICOTINE POLACRILEX 2 MG MT GUM
2.0000 mg | CHEWING_GUM | OROMUCOSAL | Status: DC | PRN
Start: 2020-06-29 — End: 2020-07-05
  Administered 2020-07-01 – 2020-07-05 (×7): 2 mg via ORAL
  Filled 2020-06-29 (×8): qty 1

## 2020-06-29 MED ORDER — NICOTINE 14 MG/24HR TD PT24
1.0000 | MEDICATED_PATCH | Freq: Every day | TRANSDERMAL | Status: DC
Start: 2020-06-29 — End: 2020-07-05
  Filled 2020-06-29 (×5): qty 1
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
## 2020-06-29 NOTE — Progress Notes (Signed)
Patient has attended 0 groups so far today. Will continue to monitor, assess and encourage group attendance.

## 2020-06-29 NOTE — Progress Notes (Signed)
Checklist instructions: Score the patient once a shift and as needed. Absence of a behavior results in a score of 0. Presence or increase in the display of a behavior results in a score of 1. Maximum score (SUM) possible is 6. If a behavior is at baseline for a patient, e.g. the patient is normally confused, this will result in a score of 0. An increase in confusion will result in a score of 1.        Donna Kline is being assessed under the Broset Violence Checklist for any escalations in potentially violent or harmful behavior.             Date of Admission: 06/28/2020                                                                 Date of Assessment: 06/29/2020    Assessment Time 0800         Confused 0         Irritable 0         Boisterous 0         Verbal Threats 0         Physical Threats 0         Attacking Objects 0         SUM 0               TW attempted the following interventions based on the patient's sum total score assessed on the BVC: 1= Monitoring.        Should the patient require multiple interventions or fails to engage in de-escalation with staff, please review the plan for managing the care of an escalated patient.  Please do the following:  1.  Immediately notify Security and Nursing Administrative Supervisor/Director to respond  2.  Conduct team huddle to rule out clinical causes for escalated behavior  3.  Review the note authored by the RN or Team Member for additional information  4.  Document specific behaviors observed  5.  Consider SAFE Team call for additional resources                    The Broset Violence Checklist (BVC) is a 6 item inventory that was designed to assist in the prediction of imminent violent behavior (24 hours perspective) in healthcare and other sectors where workplace violence is acknowledged as a serious problem.   The BVC does not give instructions on what to do or what interventions to provide when a violent episode occurs; it is a took meant to aid in the risk  assessment process. Interventions are based on the individual, the environment, and cultural sensitivity indicators.

## 2020-06-29 NOTE — Plan of Care (Signed)
Problem: At risk for harm to others AS EVIDENCED BY...  Goal: Will report reduction of thoughts to harm others and no attempts  Outcome: Progressing  Goal: Verbalizes understanding of medication, benefits, and side effects  Outcome: Progressing   Patient was alert and oriented x2. She denied SI, HI, AVH, and pain. Patient reported her mood as "fine." Patient was calm and cooperative upon approach however, she was minimally engaged during the assessment. Patient's speech was slow and soft. Patient remained isolative to room and reclusive to self. Patient was able to make her needs known. She was compliant with medication. Will continue to monitor patient for safety.
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
## 2020-06-29 NOTE — H&P (Signed)
ADMISSION HISTORY AND PHYSICAL EXAM    Date Time: 06/29/20 7:28 PM  Patient Name: Donna Kline  Attending Physician: Donnetta Hutching, MD    Assessment:   Donna Kline is a 40 year old female with history of bipolar disorder who presents to the hospital with agitation and bizarre behavior (see psychiatry H&P done 06/28/20 for more detail on HPI). Regarding social history, patient reports that she lives in [place] alone. She works as a Recruitment consultant. She admits to marijuana use and smokes cigarettes regularly for the last 5-10 years. The exact amount patient would not specify. She denies alcohol consumption and she denies being sexually active. Currently patient denies SI/HI/AVH. She has no somatic complaints either. She seeks care at this time.   Plan:   1.) HTN  - BP consistently over 140/90 mmhg.  - Most recent diagnostic tests (ie., CBC, CMP, UA) unremarkable.  - Recommend checking ECG 12 lead and TSH.   - Educated patient on lifestyle changes.   - Continue Norvasc 5 mg PO daily.   - Continue heart healthy diet.  - Continue to monitor VS.  2.) Smoker  - Educated patient on the risks of smoking.   - Advised patient to quit smoking.  - NRT initiated.   3.) Agitation, bizarre behavior  - Patient has history of bipolar 1 disorder.  - PE largely unremarkable and vital signs stable.  - All tests done at admission reviewed and unremarkable.  - Continue plan of care as per psychiatric treatment team.  Past Medical History:     Past Medical History:   Diagnosis Date    Bipolar 1 disorder      Past Surgical History:   No past surgical history on file.    Family History:   No family history on file.    Social History:     Social History     Socioeconomic History    Marital status: Single     Spouse name: Not on file    Number of children: Not on file    Years of education: Not on file    Highest education level: Not on file   Occupational History    Not on file   Tobacco Use    Smoking status: Not on file     Smokeless tobacco: Not on file   Substance and Sexual Activity    Alcohol use: Not on file    Drug use: Not on file    Sexual activity: Not on file   Other Topics Concern    Not on file   Social History Narrative    Not on file     Social Determinants of Health     Financial Resource Strain:     Difficulty of Paying Living Expenses: Not on file   Food Insecurity:     Worried About Running Out of Food in the Last Year: Not on file    Ran Out of Food in the Last Year: Not on file   Transportation Needs:     Lack of Transportation (Medical): Not on file    Lack of Transportation (Non-Medical): Not on file   Physical Activity:     Days of Exercise per Week: Not on file    Minutes of Exercise per Session: Not on file   Stress:     Feeling of Stress : Not on file   Social Connections:     Frequency of Communication with Friends and Family: Not on file  Frequency of Social Gatherings with Friends and Family: Not on file    Attends Religious Services: Not on file    Active Member of Clubs or Organizations: Not on file    Attends Banker Meetings: Not on file    Marital Status: Not on file   Intimate Partner Violence:     Fear of Current or Ex-Partner: Not on file    Emotionally Abused: Not on file    Physically Abused: Not on file    Sexually Abused: Not on file   Housing Stability:     Unable to Pay for Housing in the Last Year: Not on file    Number of Places Lived in the Last Year: Not on file    Unstable Housing in the Last Year: Not on file     Allergies:   No Known Allergies    Medications:     No medications prior to admission.     Review of Systems:   A comprehensive review of systems was:     Constitutional: Patient denies fever, chills, headaches, fatigue.   Neuro: Denies headaches, dizziness, visual disturbances, auditory disturbances, coordination difficulties, muscle weakness, paresthesias.  Psychiatric: Currently denies SI/HI/AVH.   Cardiovascular: Denies chest pain,  extremity swelling, palpitations.  Pulmonary: Denies SOB, wheezing, coughing.  ENT: No ear pain, sinus pain/congestion, or sore throat.   Gastrointestinal: Denies N/V/D, constipation, or abdominal pain.  Genitourinary: Denies hematuria, pyuria, dysuria, urinary frequency, hesitancy.  Gynecologic: Denies abnormal vaginal bleeding or discharge. Currently menstruating. Reports regular menses.   Integumentary: Denies any rash or lesions. No wounds.   Musculoskeletal: Denies any muscle pain. Denies any injury.    Physical Exam:     Vitals:    06/29/20 1514   BP: 139/88   Pulse: (!) 108   Resp:    Temp: 98.2 F (36.8 C)   SpO2: 97%     Intake and Output Summary (Last 24 hours) at Date Time  No intake or output data in the 24 hours ending 06/29/20 1928    General: Patient well nourished. BMI 46.56 kg/m2.   Head: No wounds. Normocephalic.   Eyes: PERRLA. Eyes are anicteric.   ENT: Mucous membranes are moist and intact.   Neck: Trachea midline.   Neuro: A&Ox4. CNll-CNXll examined & WNL. Patellar DTR 2+ bilaterally. Pt demonstrates normal strength. Pt ambulating well. No visible tremor.   Cardiac: Heart regular. Rate WNL. No murmur, rubs, or gallops. Peripheral pulses are full & intact. Cap refill < 3 sec. No LE swelling or redness.   Pulmonary: LCTAB. No signs of SOB or dyspnea.   GI: Normoactive bowel sounds. Abd is soft & non-tender to touch. No distention.   GU: This portion of PE inappropriate as patient denies issues in this system.   OBGYN: ""  SKIN: No rashes, lesions, or wounds noted.   MS: FROM both passively & actively. Pt has 5+ strength. Ambulating well.  Psych: Slightly irritable. Denies SI/HI/AVH.     Labs:     Results     Procedure Component Value Units Date/Time    Lipid panel [[id number]]  (Abnormal) Collected: 06/29/20 0706    Specimen: Blood Updated: 06/29/20 0936     Cholesterol 221 mg/dL      Triglycerides 92 mg/dL      HDL 46 mg/dL      LDL Calculated 440 mg/dL      VLDL Calculated 18 mg/dL       Cholesterol / HDL Ratio 4.8  Hemoglobin A1C [[id number]] Collected: 06/29/20 0706    Specimen: Blood Updated: 06/29/20 0933     Hemoglobin A1C 5.3 %      Average Estimated Glucose 105.4 mg/dL         Signed by: Evorn Gong, FNP-BC

## 2020-06-29 NOTE — Progress Notes (Signed)
Donna Kline is sleeping in a bed with her eyes closed. She slept for 9 hours and no distress noted. Safety is maintained.

## 2020-06-30 LAB — TSH: TSH: 0.34 u[IU]/mL — ABNORMAL LOW (ref 0.35–4.94)

## 2020-06-30 NOTE — Progress Notes (Signed)
Worker spoke to the pt. To see if she would let this worker talk to the [place] since she is involved with the [place]. Pt. Says she wants to go to her hearing first, then she will talk to this worker. Worker will touch base with her after her TDO court hearing.
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
## 2020-06-30 NOTE — Progress Notes (Signed)
MD asked this worker to find out the status of the TDO hearing. Worker spoke to [facility] and the pt. Is a CMA and they will fax the TDO results to this worker to [phone]. RN was alerted about the hearing result.

## 2020-06-30 NOTE — Plan of Care (Signed)
Problem: At risk for harm to others AS EVIDENCED BY...  Goal: Will identify long and short term goals based on individual needs and strengths  Outcome: Not Progressing  Goal: Identifies triggers and protective strategies  Outcome: Not Progressing  Goal: Verbalizes understanding of medication, benefits, and side effects  Outcome: Not Progressing  Goal: Identify and participate in supportive program therapies  Outcome: Not Progressing  Goal: Completes discharge safety and recovery plan  Outcome: Not Progressing     Problem: Discharge Barriers  Goal: Patient will be discharged home or other facility with appropriate resources  Outcome: Not Progressing     Problem: At risk for harm to others AS EVIDENCED BY...  Goal: Will report reduction of thoughts to harm others and no attempts  Outcome: Progressing     Problem: Safety  Goal: Patient will be free from injury during hospitalization  Outcome: Progressing  Goal: Patient will be free from infection during hospitalization  Outcome: Progressing     Problem: Pain  Goal: Pain at adequate level as identified by patient  Outcome: Progressing     Problem: Side Effects from Pain Analgesia  Goal: Patient will experience minimal side effects of analgesic therapy  Outcome: Progressing     Problem: Psychosocial and Spiritual Needs  Goal: Demonstrates ability to cope with hospitalization/illness  Outcome: Progressing   Pt was calm, pleasant and cooperative upon approach. Patient was A&Ox2. She denied SI/HI/AVH and pain. She is med compliant and required no PRNs.She is dismissive when asked questions, but in a polite manner, actually proving to be very corteous. Patient's speech was slow and soft. Patient remained isolative to room for the entire shift. She gave the appearance of being depressed, presenting with a flat affect and speaking in a soft, slow voice. When staff spoke to her about why she was here she was evasive with the reasons and never came around to answering the  question. Pt was able to make needs known. Pt was observed resting with eyes closed with unlabored breathing for 8 hours. Staff will continue to monitor for safety.
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
## 2020-06-30 NOTE — Plan of Care (Signed)
Problem: Psychosocial and Spiritual Needs  Goal: Demonstrates ability to cope with hospitalization/illness  Outcome: Progressing     Upon entering her room this morning, Donna Kline was awake lying in bed.  She was not receptive to greeting and introduction from Charity fundraiser.  She is A+O to Person, Place, and Time.  Her speech was loud, hyper-verbal, and pressured.  It was a challenge for RN to get a word in edgewise.  Her thought process was disorganized.  RN inquired about her elevated BP and she immediately started rambling about her chronic pain all over her body citing that that is the reason her BP is high.  She went on to further say, "I'm tired of having to repeat myself all the time.  Don't any of you talk to each other?  Look in my chart."  When RN asked how she slept last night, she angrily stated, "What do you think?  I was in pain all night."  She did, however, deny SI/HI/AVH or thoughts of wanting to harm herself.  She accepted her 0900 dose of Risperdal 2 MG PO.  She has been visible in the Milieu for brief periods either attending group, sitting in the [employer], or talking on the Unit Telephone.  She has been isolative to herself.  She did not make her needs known.  Her appetite is good.  She attended to her ADL's.  [name] has maintained safety.  RN will monitor for continued safe behavior.
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
## 2020-06-30 NOTE — Progress Notes (Signed)
Name of Group: Recreation Therapy   RT Intervention: Lyric Analysis  Purpose: To manage emotions and encourage expression of feelings in a healthy manner.   RT Goal: Prior to d/c, pt will ID min. 5 leisure coping skills as evidence by completing Leisure Coping Skills Plan to assist with increasing leisure awareness and managing emotions after d/c.  Pt Progress Toward Goal:  RT introduced group by providing education on the the use of music to assist with identifying and expressing emotions. Pt was encouraged to actively listen to the lyrics and identify parts in the lyrics that they find to be significant. Pt displayed a high level of participation in group by actively participating in group discussions. Pt was able to identify an emotion and/or memory after listening to each song however, her speech was disorganized and tangential. Pt became irritable at times during group when she did not understand RT's instruction. Pt was redirectable. Continued recreation therapy services are recommended to increase increase self-awareness, develop emotional identification/expression skills and to practice healthy coping skills.

## 2020-06-30 NOTE — Progress Notes (Signed)
Checklist instructions: Score the patient once a shift and as needed. Absence of a behavior results in a score of 0. Presence or increase in the display of a behavior results in a score of 1. Maximum score (SUM) possible is 6. If a behavior is at baseline for a patient, e.g. the patient is normally confused, this will result in a score of 0. An increase in confusion will result in a score of 1.        Donna Kline is being assessed under the Broset Violence Checklist for any escalations in potentially violent or harmful behavior.             Date of Admission: 06/28/2020                                                                 Date of Assessment: 06/29/2020    Assessment Time 0800         Confused 0         Irritable 0         Boisterous 0         Verbal Threats 0         Physical Threats 0         Attacking Objects 0         SUM 0               TW attempted the following interventions based on the patient's sum total score assessed on the BVC: 1= Monitoring.        Should the patient require multiple interventions or fails to engage in de-escalation with staff, please review the plan for managing the care of an escalated patient.  Please do the following:  1.  Immediately notify Security and Nursing Administrative Supervisor/Director to respond  2.  Conduct team huddle to rule out clinical causes for escalated behavior  3.  Review the note authored by the Associate Professor for additional information  4.  Document specific behaviors observed  5.  Consider SAFE Team call for additional resources                    The Broset Violence Checklist (BVC) is a 6 item inventory that was designed to assist in the prediction of imminent violent behavior (24 hours perspective) in healthcare and other sectors where workplace violence is acknowledged as a serious problem.   The BVC does not give instructions on what to do or what interventions to provide when a violent episode occurs; it is a took meant to aid in the risk  assessment process. Interventions are based on the individual, the environment, and cultural sensitivity indicators.

## 2020-07-01 DIAGNOSIS — F29 Unspecified psychosis not due to a substance or known physiological condition: Secondary | ICD-10-CM

## 2020-07-01 LAB — ECG 12-LEAD
Atrial Rate: 94 {beats}/min
P Axis: 48 degrees
P-R Interval: 130 ms
Q-T Interval: 350 ms
QRS Duration: 78 ms
QTC Calculation (Bezet): 437 ms
R Axis: 30 degrees
T Axis: 25 degrees
Ventricular Rate: 94 {beats}/min

## 2020-07-01 LAB — T3, FREE: T3, Free: 2.36 pg/mL (ref 1.71–3.71)

## 2020-07-01 LAB — T4, FREE: T4 Free: 1.24 ng/dL (ref 0.70–1.48)

## 2020-07-01 MED ORDER — AMLODIPINE BESYLATE 5 MG PO TABS
5.0000 mg | ORAL_TABLET | Freq: Once | ORAL | Status: AC
Start: 2020-07-01 — End: 2020-07-01
  Administered 2020-07-01: 12:00:00 5 mg via ORAL
  Filled 2020-07-01: qty 1

## 2020-07-01 MED ORDER — AMLODIPINE BESYLATE 5 MG PO TABS
10.0000 mg | ORAL_TABLET | Freq: Every day | ORAL | Status: DC
Start: 2020-07-02 — End: 2020-07-05
  Administered 2020-07-02 – 2020-07-05 (×4): 10 mg via ORAL
  Filled 2020-07-01 (×4): qty 2

## 2020-07-01 MED ORDER — RISPERIDONE 2 MG PO TBDP
2.0000 mg | ORAL_TABLET | Freq: Two times a day (BID) | ORAL | Status: DC
Start: 2020-07-01 — End: 2020-07-05
  Administered 2020-07-01 – 2020-07-05 (×8): 2 mg via ORAL
  Filled 2020-07-01 (×8): qty 1

## 2020-07-01 NOTE — Progress Notes (Addendum)
Pt attended  1 group. Pt presented with bright affect but was not able to sit still  Pt described her chronic pain and how it has impacted her life.Pt was hyper-verbal and tangential.Pt stated that she is here because she believes that she is a "bad" person. Pt was unable to elaborate.Will continue to monitor, assess and encourage group attendance.

## 2020-07-01 NOTE — Progress Notes (Signed)
Psychiatry Progress Note  (Level 1 = Problem Focused, Level 2 = Expanded Problem Focused, Level 3 = Detailed)    Patient Name: Donna Kline            Current Date/Time:  07/01/2020  MRN:  [id number]                            Attending Physician: Donnetta Hutching, MD  DOB: 1981-06-16                                Gender: female                              I. History   Informants:   Patient, nursing staff and medical records.  Patient was seen today and discussed with multidisciplinary treatment team    Legal Status:  TDO    A. Chief Complaint or Reason for Admission        Agitation and Bizarre behavior    B.History of Present Illness: Interval History     (Symptoms and qualifiers:1 for Level 1, 2 for Level 2 and 3+ for Level 42)  40 year old female with history of bipolar disorder who presents to the hospital with agitation and bizarre behavior. I saw the patient today. She still has disorganized thoughts and her focus today was on her pain all over her body and she started describing her pain. She denied any SI or HI. She had poor insight about he MH symptoms.     C.Medical History  Review of Systems  (Level 1 is none, Level 2 is 1, and Level 3 is 2+)  All other systems were reviewed and found negative except as above.    D.Additional Past Psychiatric, Substance Use, Medical, Family and Social History    Psychiatric: No new information available as compared to previous encounter(s)  Substance Use: No new information available as compared to previous encounter(s)  Medical: No new information available as compared to previous encounter(s)  Family: No new information available as compared to previous encounter(s)  Social: No new information available as compared to previous encounter(s)    Medications:    Current Facility-Administered Medications:     [START ON 07/02/2020] amLODIPine (NORVASC) tablet 10 mg, 10 mg, Oral, Daily, Kanaan, Alexander A, FNP    clonazePAM (KlonoPIN) tablet 0.5 mg, 0.5 mg, Oral, Q4H PRN,  Hilbert Corrigan, Mohammed A, MD, 0.5 mg at 06/30/20 2038    nicotine (NICODERM CQ) 14 MG/24HR patch 1 patch, 1 patch, Transdermal, Daily, Kanaan, Alexander A, FNP    nicotine polacrilex (NICORETTE) gum 2 mg, 2 mg, Oral, Q2H PRN, Kanaan, Alexander A, FNP    OLANZapine (ZyPREXA) tablet 10 mg, 10 mg, Oral, BID PRN, Hilbert Corrigan, Mohammed A, MD    risperiDONE (RisperDAL M-TABS) disintegrating tablet 2 mg, 2 mg, Oral, Q12H SCH, Abaigeal Moomaw, MD      II. Examination   Vital signs reviewed:   Blood pressure (!) 146/99, pulse 98, temperature 98.6 F (37 C), temperature source Oral, resp. rate 18, height 1.626 m (5' 4.02"), weight 123.1 kg (271 lb 6.4 oz), SpO2 100 %.     Mental Status Exam  (Level 1 is 1-5, Level 2 is 6-8, Level 3 is 9+)  General appearance: Appears chronological age,found in bed covered with blanket  Attitude/Behavior: Calm, Uncooperative, Eye Contact  is  Good, Guarded and Suspicious  Motor: No abnormalities noted  Gait: Not observed  Muscle strength and tone: looks normal  Speech:   Spontaneous: Yes  Rate and Rhythm: Increased  Volume: increased  Tone: high  Clarity: Yes  Mood: "ok"  Affect:   Range: Constricted  Stability: Labile  Appropriateness to thought content: No  Intensity: Normal  Thought Process:   Disorganized  Associations: Tangential  Thought Content:   Delusions:  Yes   Cognitions:  Normal  Suicidal:  No suicidal thoughts  Homicidal:  No homicidal thoughts  Violent Thoughts:  No  Perceptions:   Hallucinations: No  Insight: Poor  Judgment: Poor  Cognition:   Level of Consciousness: Intact  Orientation: Place: Yes  Self: Yes  Recent Memory: Impaired    Psychiatric / Cognitive Instruments: None    Pertinent Physical Exam: Not relevant to current chief complaint/reason for admission    Imaging / EKG / Labs: Labs in the last 24 hours  Results     Procedure Component Value Units Date/Time    T4, free [[id number]] Collected: 07/01/20 0927    Specimen: Blood Updated: 07/01/20 1216     T4 Free 1.24 ng/dL     T3,  free [[id number]] Collected: 07/01/20 0927    Specimen: Blood Updated: 07/01/20 0929          III. Assessment and Plan (Medical Decision Making)     1.  I certify that inpatient psychiatric hospital services furnished since the previous certification were, and continue to be medically necessary for treatment which could reasonably be expected to improve the patient's condition, or for diagnostic study.  The patient continues to need on a daily basis, active treatment furnished by and supervised by inpatient psychiatric facility personnel, including every 15 minutes safety checks for Psychosis    2. Psychiatric Diagnoses  Schizoaffective TypeBipolar Type,  Cannabis Use Disorder,  Alcohol Use Disorder    3.All diagnostic procedures completed since admission were reviewed. Past medical records reviewed. Coordination of care was discussed with inpatient team and as available with the outpatient team.    4. On this admission patient educated about and provided input into their treatment plan.  Patient understands potential risks and benefits of proposed treatment plan.     5.  Assessment   40 year old female with history of bipolar disorder who presents to the hospital with agitation and bizarre behavior    Suicide Risk Assessment  Suicide Risk Assessment performed? Patient denies  Suicide Thoughts / Behaviors: None  Current Plan: None  Access to firearm: No  Family history of suicide: No  Currently Intoxicated: No      Plan / Recommendations:    Biological Plan:  Medications:   Change the Risperidone 2 mg BID to the Risperdal M-tab 2 mg twice daily as recommended by the pt's unit attending who passed a message to me to do so.   Medical Work-up: None required at this time  Consults: None required at this time    Psychosocial Plan:  Individual therapy: Psycho-education and psychotherapy of the following modalities will continue to be provided during daily visits:Supportive and Insight Oriented Psychotherapy  Group and  milieu therapies: daily per unit's schedule.  Continue with Social Work intervention provided for discharge planning including assistance with follow-up psychiatric care.    Plan for Family Involvement: Yes, with patient's consent  Other Providers Contact Information and Dates Contacted: No Updates    Total Attending time spent 30 minutes (floor time)  with more than 50 percent of time in direct patient contact, coordinating care and counseling.      Signed by: Ardeen Garland, MD  07/01/2020

## 2020-07-01 NOTE — Plan of Care (Addendum)
Problem: At risk for harm to others AS EVIDENCED BY...  Goal: Will report reduction of thoughts to harm others and no attempts  Outcome: Progressing  Goal: Verbalizes understanding of medication, benefits, and side effects  Outcome: Progressing   Patient was alert and oriented x4. She denied SI, HI, AVH, and pain. Patient reported her mood as "better." Patient presented with a brighter affect today. Patient was calm and cooperative upon approach. Patient was visible in the milieu and was seen walking in the hallway. Patient was seen interacting with other patients. Patient was able to make her needs known. She was compliant with medication. Will continue to monitor patient for safety.

## 2020-07-01 NOTE — Progress Notes (Signed)
Checklist instructions: Score the patient once a shift and as needed. Absence of a behavior results in a score of 0. Presence or increase in the display of a behavior results in a score of 1. Maximum score (SUM) possible is 6. If a behavior is at baseline for a patient, e.g. the patient is normally confused, this will result in a score of 0. An increase in confusion will result in a score of 1.        Donna Kline is being assessed under the Broset Violence Checklist for any escalations in potentially violent or harmful behavior.             Date of Assessment:          06/30/2020    Assessment Time 2000         Confused 0         Irritable 0         Boisterous 0         Verbal Threats 0         Physical Threats 0         Attacking Objects 0         SUM 0               TW attempted the following interventions based on the patient's sum total score assessed on the BVC: 1= Monitoring.        Should the patient require multiple interventions or fails to engage in de-escalation with staff, please review the plan for managing the care of an escalated patient.  Please do the following:  1.  Immediately notify Security and Nursing Administrative Supervisor/Director to respond  2.  Conduct team huddle to rule out clinical causes for escalated behavior  3.  Review the note authored by the Associate Professor for additional information  4.  Document specific behaviors observed  5.  Consider SAFE Team call for additional resources                    The Broset Violence Checklist (BVC) is a 6 item inventory that was designed to assist in the prediction of imminent violent behavior (24 hours perspective) in healthcare and other sectors where workplace violence is acknowledged as a serious problem.   The BVC does not give instructions on what to do or what interventions to provide when a violent episode occurs; it is a took meant to aid in the risk assessment process. Interventions are based on the individual, the environment, and  cultural sensitivity indicators.

## 2020-07-01 NOTE — Progress Notes (Signed)
Checklist instructions: Score the patient once a shift and as needed. Absence of a behavior results in a score of 0. Presence or increase in the display of a behavior results in a score of 1. Maximum score (SUM) possible is 6. If a behavior is at baseline for a patient, e.g. the patient is normally confused, this will result in a score of 0. An increase in confusion will result in a score of 1.      Donna Kline is being assessed under the Broset Violence Checklist for any escalations in potentially violent or harmful behavior.     Date of Admission: 06/28/2020                                                                 Date of Assessment: 07/01/2020    Assessment Time 0800      Confused 0      Irritable 0      Boisterous 0      Verbal Threats 0      Physical Threats 0      Attacking Objects 0      SUM 0          TW attempted the following interventions based on the patient's sum total score assessed on the BVC: 1= Monitoring.      Should the patient require multiple interventions or fails to engage in de-escalation with staff, please review the plan for managing the care of an escalated patient.  Please do the following:  1.  Immediately notify Security and Nursing Administrative Supervisor/Director to respond  2.  Conduct team huddle to rule out clinical causes for escalated behavior  3.  Review the note authored by the Associate Professor for additional information  4.  Document specific behaviors observed  5.  Consider SAFE Team call for additional resources              The Broset Violence Checklist (BVC) is a 6 item inventory that was designed to assist in the prediction of imminent violent behavior (24 hours perspective) in healthcare and other sectors where workplace violence is acknowledged as a serious problem.   The BVC does not give instructions on what to do or what interventions to provide when a violent episode occurs; it is a took meant to aid in the risk assessment process. Interventions are based  on the individual, the environment, and cultural sensitivity indicators.

## 2020-07-01 NOTE — Progress Notes (Signed)
Follow up: HTN    Psychiatrist notified me that patient's blood pressure consistently elevated as most recent BP 137/100 mmhg. Reviewed most recent labs and largely unremarkable though TSH noted to be 34 uIu/mL and FT3 and FT4 is now pending. ECG 12 lead reviewed and unremarkable. All other labs such as CBC, CMP, and UA unremarkable as well. Per psych team patient not expressing any physical symptoms. Will at this time increase Norvasc to 10 mg PO daily. Continue on heart healthy diet W/ 2 GM Na+ restriction. Continue monitoring VS while IP as directed. Will follow along as needed for this issue.     Crissie Figures, FNP-BC

## 2020-07-01 NOTE — Plan of Care (Signed)
Problem: At risk for harm to others AS EVIDENCED BY...  Goal: Will identify long and short term goals based on individual needs and strengths  Outcome: Not Progressing  Goal: Identifies triggers and protective strategies  Outcome: Not Progressing  Goal: Verbalizes understanding of medication, benefits, and side effects  Outcome: Not Progressing  Goal: Completes discharge safety and recovery plan  Outcome: Not Progressing     Problem: Discharge Barriers  Goal: Patient will be discharged home or other facility with appropriate resources  Outcome: Not Progressing     Problem: At risk for harm to others AS EVIDENCED BY...  Goal: Will report reduction of thoughts to harm others and no attempts  Outcome: Progressing  Goal: Identify and participate in supportive program therapies  Outcome: Progressing     Problem: Safety  Goal: Patient will be free from injury during hospitalization  Outcome: Progressing  Goal: Patient will be free from infection during hospitalization  Outcome: Progressing     Problem: Pain  Goal: Pain at adequate level as identified by patient  Outcome: Progressing     Problem: Side Effects from Pain Analgesia  Goal: Patient will experience minimal side effects of analgesic therapy  Outcome: Progressing     Problem: Psychosocial and Spiritual Needs  Goal: Demonstrates ability to cope with hospitalization/illness  Outcome: Progressing   Pt was very pleasant and cooperative upon approach, but clearly anxious. Patient is still  A&Ox2, d/o to time and situation. She denied SI/HI/AVH and pain. She is med compliant and received Clonazepam 0.5 for anxiety, with little effect. She remains courteous, but was hyperverbal and prone to loose associations. Patient was isolative to her room and could be found in the dining area today with peers. Her attitude earlier in the shift was very upbeat and positive, but later in the evening she became more guarded and uncooperative. Her affect was blunted. She remains  evasive when asked questions. Her BP was high at the beginning of the shift (159/107) and reached (169/118), but was otherwise asymptomatic. Physician was called and notified. Staff was directed to monitor BP if she wakes, but otherwise let her sleep. Ativan was ordered to address the symptoms if she wakes.  Pt was able to make needs known. Pt was observed resting with eyes closed with unlabored breathing for 8 hours. Staff will continue to monitor for safety.

## 2020-07-02 MED ORDER — ACETAMINOPHEN 325 MG PO TABS
650.0000 mg | ORAL_TABLET | Freq: Four times a day (QID) | ORAL | Status: DC | PRN
Start: 2020-07-02 — End: 2020-07-05
  Administered 2020-07-03: 650 mg via ORAL
  Filled 2020-07-02: qty 2

## 2020-07-02 NOTE — Progress Notes (Addendum)
Pt attended 2 groups. In group therapy pt was hyper verbal with pressured speech. TW was constantly redirecting pt and it created a rather unwieldy group process.Interestingly pt was able to concentrate on her project in creative therapy and hardly spoke a word. It seems to TW that pt has more control over her behavior that she lets on.Will continue to monitor, assess and encourage group attendance.

## 2020-07-02 NOTE — Progress Notes (Signed)
Checklist instructions: Score the patient once a shift and as needed. Absence of a behavior results in a score of 0. Presence or increase in the display of a behavior results in a score of 1. Maximum score (SUM) possible is 6. If a behavior is at baseline for a patient, e.g. the patient is normally confused, this will result in a score of 0. An increase in confusion will result in a score of 1.        Donna Kline is being assessed under the Broset Violence Checklist for any escalations in potentially violent or harmful behavior.             Date of Assessment:          07/01/2020    Assessment Time 2000         Confused 0         Irritable 0         Boisterous 0         Verbal Threats 0         Physical Threats 0         Attacking Objects 0         SUM 0               TW attempted the following interventions based on the patient's sum total score assessed on the BVC: 1= Monitoring.        Should the patient require multiple interventions or fails to engage in de-escalation with staff, please review the plan for managing the care of an escalated patient.  Please do the following:  1.  Immediately notify Security and Nursing Administrative Supervisor/Director to respond  2.  Conduct team huddle to rule out clinical causes for escalated behavior  3.  Review the note authored by the Associate Professor for additional information  4.  Document specific behaviors observed  5.  Consider SAFE Team call for additional resources                    The Broset Violence Checklist (BVC) is a 6 item inventory that was designed to assist in the prediction of imminent violent behavior (24 hours perspective) in healthcare and other sectors where workplace violence is acknowledged as a serious problem.   The BVC does not give instructions on what to do or what interventions to provide when a violent episode occurs; it is a took meant to aid in the risk assessment process. Interventions are based on the individual, the environment, and  cultural sensitivity indicators.
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
## 2020-07-02 NOTE — Progress Notes (Signed)
Psychiatry Progress Note  (Level 1 = Problem Focused, Level 2 = Expanded Problem Focused, Level 3 = Detailed)    Patient Name: Donna Kline            Current Date/Time:  07/02/2020  MRN:  [id number]                            Attending Physician: Donnetta Hutching, MD  DOB: [birth year]                                Gender: female                              I. History   Informants:   Patient, nursing staff and medical records.  Patient was seen today and discussed with multidisciplinary treatment team    Legal Status:  TDO    A. Chief Complaint or Reason for Admission        Agitation and Bizarre behavior    B.History of Present Illness: Interval History     (Symptoms and qualifiers:1 for Level 1, 2 for Level 2 and 3+ for Level 1)  40 year old female with history of bipolar disorder who presents to the hospital with agitation and bizarre behavior. I saw the patient today. She still has disorganized thoughts and her focus today was on her pain all over her body and she started describing her pain and how it has impacted her life. She was hyper verbal and with Flight of ideas. She denied any SI or HI. She had poor insight about he MH symptoms.     C.Medical History  Review of Systems  (Level 1 is none, Level 2 is 1, and Level 3 is 2+)  All other systems were reviewed and found negative except as above.    D.Additional Past Psychiatric, Substance Use, Medical, Family and Social History    Psychiatric: No new information available as compared to previous encounter(s)  Substance Use: No new information available as compared to previous encounter(s)  Medical: No new information available as compared to previous encounter(s)  Family: No new information available as compared to previous encounter(s)  Social: No new information available as compared to previous encounter(s)    Medications:    Current Facility-Administered Medications:     amLODIPine (NORVASC) tablet 10 mg, 10 mg, Oral, Daily, Kanaan, Alexander A, FNP, 10 mg at  07/02/20 0859    clonazePAM (KlonoPIN) tablet 0.5 mg, 0.5 mg, Oral, Q4H PRN, Hilbert Corrigan, Mohammed A, MD, 0.5 mg at 07/01/20 2128    nicotine (NICODERM CQ) 14 MG/24HR patch 1 patch, 1 patch, Transdermal, Daily, Kanaan, Alexander A, FNP, 1 patch at 07/02/20 0858    nicotine polacrilex (NICORETTE) gum 2 mg, 2 mg, Oral, Q2H PRN, Kanaan, Alexander A, FNP, 2 mg at 07/01/20 1949    OLANZapine (ZyPREXA) tablet 10 mg, 10 mg, Oral, BID PRN, Hilbert Corrigan, Mohammed A, MD    risperiDONE (RisperDAL M-TABS) disintegrating tablet 2 mg, 2 mg, Oral, Q12H SCH, Dillion Stowers, MD, 2 mg at 07/02/20 0858      II. Examination   Vital signs reviewed:   Blood pressure 125/85, pulse (!) 122, temperature 97.7 F (36.5 C), temperature source Oral, resp. rate 18, height 1.626 m (5' 4.02"), weight 123.1 kg (271 lb 6.4 oz), SpO2 100 %.  Mental Status Exam  (Level 1 is 1-5, Level 2 is 6-8, Level 3 is 9+)  General appearance: Appears chronological age,found in bed covered with blanket  Attitude/Behavior: Calm, Uncooperative, Eye Contact is  Good, Guarded and Suspicious  Motor: No abnormalities noted  Gait: Not observed  Muscle strength and tone: looks normal  Speech:   Spontaneous: Yes  Rate and Rhythm: Increased  Volume: increased  Tone: high  Clarity: Yes  Mood: "ok"  Affect:   Range: Constricted  Stability: Labile  Appropriateness to thought content: No  Intensity: Normal  Thought Process:   Disorganized  Associations: Tangential  Thought Content:   Delusions:  Yes   Cognitions:  Normal  Suicidal:  No suicidal thoughts  Homicidal:  No homicidal thoughts  Violent Thoughts:  No  Perceptions:   Hallucinations: No  Insight: Poor  Judgment: Poor  Cognition:   Level of Consciousness: Intact  Orientation: Place: Yes  Self: Yes  Recent Memory: Impaired    Psychiatric / Cognitive Instruments: None    Pertinent Physical Exam: Not relevant to current chief complaint/reason for admission    Imaging / EKG / Labs: Labs in the last 24 hours  Results     Procedure  Component Value Units Date/Time    T3, free [[id number]] Collected: 07/01/20 0927    Specimen: Blood Updated: 07/01/20 1334     T3, Free 2.36 pg/mL     T4, free [[id number]] Collected: 07/01/20 0927    Specimen: Blood Updated: 07/01/20 1216     T4 Free 1.24 ng/dL           III. Assessment and Plan (Medical Decision Making)     1.  I certify that inpatient psychiatric hospital services furnished since the previous certification were, and continue to be medically necessary for treatment which could reasonably be expected to improve the patient's condition, or for diagnostic study.  The patient continues to need on a daily basis, active treatment furnished by and supervised by inpatient psychiatric facility personnel, including every 15 minutes safety checks for Psychosis    2. Psychiatric Diagnoses  Schizoaffective TypeBipolar Type,  Cannabis Use Disorder,  Alcohol Use Disorder    3.All diagnostic procedures completed since admission were reviewed. Past medical records reviewed. Coordination of care was discussed with inpatient team and as available with the outpatient team.    4. On this admission patient educated about and provided input into their treatment plan.  Patient understands potential risks and benefits of proposed treatment plan.     5.  Assessment   40 year old female with history of bipolar disorder who presents to the hospital with agitation and bizarre behavior    Suicide Risk Assessment  Suicide Risk Assessment performed? Patient denies  Suicide Thoughts / Behaviors: None  Current Plan: None  Access to firearm: No  Family history of suicide: No  Currently Intoxicated: No      Plan / Recommendations:    Biological Plan:  Medications:   Continue Risperdal M-tab 2 mg twice daily as recommended by the pt's unit attending.  Medical Work-up: None required at this time  Consults: None required at this time    Psychosocial Plan:  Individual therapy: Psycho-education and psychotherapy of the following  modalities will continue to be provided during daily visits:Supportive and Insight Oriented Psychotherapy  Group and milieu therapies: daily per unit's schedule.  Continue with Social Work intervention provided for discharge planning including assistance with follow-up psychiatric care.    Plan for Family Involvement:  Yes, with patient's consent  Other Providers Contact Information and Dates Contacted: No Updates    Total Attending time spent 30 minutes (floor time) with more than 50 percent of time in direct patient contact, coordinating care and counseling.      Signed by: Ardeen Garland, MD  07/02/2020

## 2020-07-02 NOTE — Progress Notes (Signed)
Checklist instructions: Score the patient once a shift and as needed. Absence of a behavior results in a score of 0. Presence or increase in the display of a behavior results in a score of 1. Maximum score (SUM) possible is 6. If a behavior is at baseline for a patient, e.g. the patient is normally confused, this will result in a score of 0. An increase in confusion will result in a score of 1.      Donna Kline is being assessed under the Broset Violence Checklist for any escalations in potentially violent or harmful behavior.     Date of Admission:                                                                         Date of Assessment:    Assessment Time 0800      Confused 0      Irritable 0      Boisterous 0      Verbal Threats 0      Physical Threats 0      Attacking Objects 0      SUM 0          TW attempted the following interventions based on the patient's sum total score assessed on the BVC: 1= Monitoring.      Should the patient require multiple interventions or fails to engage in de-escalation with staff, please review the plan for managing the care of an escalated patient.  Please do the following:  1.  Immediately notify Security and Nursing Administrative Supervisor/Director to respond  2.  Conduct team huddle to rule out clinical causes for escalated behavior  3.  Review the note authored by the Associate Professor for additional information  4.  Document specific behaviors observed  5.  Consider SAFE Team call for additional resources              The Broset Violence Checklist (BVC) is a 6 item inventory that was designed to assist in the prediction of imminent violent behavior (24 hours perspective) in healthcare and other sectors where workplace violence is acknowledged as a serious problem.   The BVC does not give instructions on what to do or what interventions to provide when a violent episode occurs; it is a took meant to aid in the risk assessment process. Interventions are based on the  individual, the environment, and cultural sensitivity indicators.

## 2020-07-02 NOTE — Plan of Care (Signed)
Problem: At risk for harm to others AS EVIDENCED BY...  Goal: Will identify long and short term goals based on individual needs and strengths  Outcome: Not Progressing  Goal: Identifies triggers and protective strategies  Outcome: Not Progressing  Goal: Verbalizes understanding of medication, benefits, and side effects  Outcome: Not Progressing  Goal: Completes discharge safety and recovery plan  Outcome: Not Progressing     Problem: Pain  Goal: Pain at adequate level as identified by patient  Outcome: Not Progressing     Problem: Side Effects from Pain Analgesia  Goal: Patient will experience minimal side effects of analgesic therapy  Outcome: Not Progressing     Problem: Discharge Barriers  Goal: Patient will be discharged home or other facility with appropriate resources  Outcome: Not Progressing     Problem: Psychosocial and Spiritual Needs  Goal: Demonstrates ability to cope with hospitalization/illness  Outcome: Not Progressing     Problem: At risk for harm to others AS EVIDENCED BY...  Goal: Will report reduction of thoughts to harm others and no attempts  Outcome: Progressing  Goal: Identify and participate in supportive program therapies  Outcome: Progressing   Pt was again very pleasant and cooperative upon approach, but again presented as very anxious. She remains A&Ox2, d/o to time and situation. She denied SI/HI/AVH and pain. She is med compliant and received Clonazepam 0.5 for anxiety again with better effect. She is still courteous, but very hyperverbal. She is prone to loose associations and bizarre statements. For much of the beginning of the shift she could be found in the dining area today with peers. She remains evasive when asked questions, usually coming up with bizarre answers to support her reasons for not taking medications in the past.. Her BP was high at the beginning of the shift (166/105), but she continues to present as asymptomatic. Staff was again directed to monitor BP if she  wakes, but otherwise let her sleep. Pt was able to make needs known. Pt was observed resting with eyes closed with unlabored breathing for 8 hours. Staff will continue to monitor for safety.

## 2020-07-03 MED ORDER — ACETAMINOPHEN 325 MG PO TABS
650.0000 mg | ORAL_TABLET | Freq: Four times a day (QID) | ORAL | Status: DC
Start: 2020-07-03 — End: 2020-07-05
  Administered 2020-07-03 – 2020-07-05 (×7): 650 mg via ORAL
  Filled 2020-07-03 (×8): qty 2

## 2020-07-03 NOTE — Progress Notes (Signed)
Name of Group: Recreation Therapy  RT Intervention:  Music and Movement  Purpose: To elevate mood, improve physical health, encourage healthy coping skills, encourage socialization and increase leisure skills.  RT Goal:  1.) Prior to d/c, pt will attend and participate in a min. of 75% of RT sessions.   2.) Prior to d/c, pt will ID min. 5 leisure coping skills as evidence by completing Leisure Plan to assist with increasing leisure awareness and identifying coping strategies    Patient progress towards goal:   RT staff greeted patient and praised patient for attending group. RT staff explained todays activity with patient. RT staff educated patient on the benefits of stretching and doing light movement with arms and legs such as mood elevation, reduce stress, and strengthens muscles. Patient engaged fully in movement exercise and was able to demonstrate an exercise to peers during debriefing. RT staff also engaged patient music listening as a positive leisure outlet and patient requested to hear 2 songs that was  [employer] savage and beyonce. RT staff observed patient singing, dancing and thanked patient for sharing songs. RT staff thanked patient for RT participation and will continue to encourage patient to attend group.    Phylliss Bob MS, LRT/CTRS

## 2020-07-03 NOTE — Plan of Care (Addendum)
Problem: At risk for harm to others AS EVIDENCED BY...  Goal: Will identify long and short term goals based on individual needs and strengths  Outcome: Progressing     Problem: At risk for harm to others AS EVIDENCED BY...  Goal: Will report reduction of thoughts to harm others and no attempts  Outcome: Progressing     Problem: At risk for harm to others AS EVIDENCED BY...  Goal: Verbalizes understanding of medication, benefits, and side effects  Outcome: Progressing     Problem: At risk for harm to others AS EVIDENCED BY...  Goal: Identify and participate in supportive program therapies  Outcome: Progressing     Problem: Safety  Goal: Patient will be free from injury during hospitalization  Outcome: Progressing     Patient was visible on the milieu watching television on the start of the shift. She was hyper verbal , disorganized and observed loose association during conversation. She was irritable on approach. She reports of improving appetite. She took her HS schedule medication.  She reports of generalized pain, pain scale of 9/10. Warm packs was given with patient request.  She denies SI/HI/AVH. Around 2315 when this RN approached to give schedule tylenol , patient was sleeping with eye closed with blanket covered all over her face. When this RN try to wake up patient for medication patient got agitated and was  loud. Vital signs stable. Will continue to monitor for safety.     Patient was observed sleeping with eye closed throughout the night. No sign and symptoms of distress was noted. Patient slept for about 7 hours.

## 2020-07-03 NOTE — Progress Notes (Signed)
Checklist instructions: Score the patient once a shift and as needed. Absence of a behavior results in a score of 0. Presence or increase in the display of a behavior results in a score of 1. Maximum score (SUM) possible is 6. If a behavior is at baseline for a patient, e.g. the patient is normally confused, this will result in a score of 0. An increase in confusion will result in a score of 1.      Donna Kline is being assessed under the Broset Violence Checklist for any escalations in potentially violent or harmful behavior.     Date of Admission: 06/28/2020                                                                 Date of Assessment: 07/03/2020    Assessment Time 0800      Confused 0      Irritable 1      Boisterous 0      Verbal Threats 0      Physical Threats 0      Attacking Objects 0      SUM 1          TW attempted the following interventions based on the patient's sum total score assessed on the BVC: 1= Monitoring.      Should the patient require multiple interventions or fails to engage in de-escalation with staff, please review the plan for managing the care of an escalated patient.  Please do the following:  1.  Immediately notify Security and Nursing Administrative Supervisor/Director to respond  2.  Conduct team huddle to rule out clinical causes for escalated behavior  3.  Review the note authored by the Associate Professor for additional information  4.  Document specific behaviors observed  5.  Consider SAFE Team call for additional resources              The Broset Violence Checklist (BVC) is a 6 item inventory that was designed to assist in the prediction of imminent violent behavior (24 hours perspective) in healthcare and other sectors where workplace violence is acknowledged as a serious problem.   The BVC does not give instructions on what to do or what interventions to provide when a violent episode occurs; it is a took meant to aid in the risk assessment process. Interventions are based  on the individual, the environment, and cultural sensitivity indicators.

## 2020-07-03 NOTE — Plan of Care (Signed)
Problem: At risk for harm to others AS EVIDENCED BY...  Goal: Will identify long and short term goals based on individual needs and strengths  Outcome: Not Progressing  Goal: Will report reduction of thoughts to harm others and no attempts  Outcome: Not Progressing  Goal: Completes discharge safety and recovery plan  Outcome: Not Progressing     Problem: At risk for harm to others AS EVIDENCED BY...  Goal: Identifies triggers and protective strategies  Outcome: Progressing  Goal: Verbalizes understanding of medication, benefits, and side effects  Outcome: Progressing  Goal: Identify and participate in supportive program therapies  Outcome: Progressing     Problem: Safety  Goal: Patient will be free from injury during hospitalization  Outcome: Progressing     Problem: Pain  Goal: Pain at adequate level as identified by patient  Outcome: Progressing     Problem: Side Effects from Pain Analgesia  Goal: Patient will experience minimal side effects of analgesic therapy  Outcome: Progressing     Problem: Discharge Barriers  Goal: Patient will be discharged home or other facility with appropriate resources  Outcome: Progressing     Problem: Psychosocial and Spiritual Needs  Goal: Demonstrates ability to cope with hospitalization/illness  Outcome: Progressing   Pt remains very pleasant and cooperative upon approach, but continues to make bizarre stamennts in regard to her conditions. She is A&Ox2, d/o to time and situation. She denies SI/HI/AVH and pain. She is med compliant and received no PRNs. She remains courteous and hyperverbal as well. She continues to be prone to loose associations and bizarre statements. She continues to be seen watching TV with peers and socializing appropriately. Her BP was lower this evening (140/91). She continues to present as asymptomatic and comes across as less anxious. Pt was able to make needs known. Pt was observed resting with eyes closed with unlabored breathing for 9 hours. Staff  will continue to monitor for safety.
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
## 2020-07-03 NOTE — Progress Notes (Signed)
Checklist instructions: Score the patient once a shift and as needed. Absence of a behavior results in a score of 0. Presence or increase in the display of a behavior results in a score of 1. Maximum score (SUM) possible is 6. If a behavior is at baseline for a patient, e.g. the patient is normally confused, this will result in a score of 0. An increase in confusion will result in a score of 1.        Donna Kline is being assessed under the Broset Violence Checklist for any escalations in potentially violent or harmful behavior.             Date of Assessment:          07/02/2020    Assessment Time 2000         Confused 0         Irritable 0         Boisterous 0         Verbal Threats 0         Physical Threats 0         Attacking Objects 0         SUM 0               TW attempted the following interventions based on the patient's sum total score assessed on the BVC: 1= Monitoring.        Should the patient require multiple interventions or fails to engage in de-escalation with staff, please review the plan for managing the care of an escalated patient.  Please do the following:  1.  Immediately notify Security and Nursing Administrative Supervisor/Director to respond  2.  Conduct team huddle to rule out clinical causes for escalated behavior  3.  Review the note authored by the Associate Professor for additional information  4.  Document specific behaviors observed  5.  Consider SAFE Team call for additional resources                    The Broset Violence Checklist (BVC) is a 6 item inventory that was designed to assist in the prediction of imminent violent behavior (24 hours perspective) in healthcare and other sectors where workplace violence is acknowledged as a serious problem.   The BVC does not give instructions on what to do or what interventions to provide when a violent episode occurs; it is a took meant to aid in the risk assessment process. Interventions are based on the individual, the environment, and  cultural sensitivity indicators.
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
## 2020-07-03 NOTE — Plan of Care (Addendum)
Problem: At risk for harm to others AS EVIDENCED BY...  Goal: Will report reduction of thoughts to harm others and no attempts  Outcome: Progressing     Problem: Safety  Goal: Patient will be free from injury during hospitalization  Outcome: Progressing   Patient was alert and oriented x3. She denied SI, HI, AVH, and pain. However, patient was seen talking to herself and thought blocking was observed. Patient was irritable and hyperverbal upon approach. Patient made various disorganized and loosely associated statements. 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 PRN 2mg  Nicorette was given at 0925 and 1413. Patient was visible in the milieu and was seen walking in the hallway. Patient was seen interacting with other patients. Patient was able to make her needs known. She was compliant with medication. Will continue to monitor patient for safety.
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
## 2020-07-04 NOTE — Progress Notes (Signed)
Checklist instructions: Score the patient once a shift and as needed. Absence of a behavior results in a score of 0. Presence or increase in the display of a behavior results in a score of 1. Maximum score (SUM) possible is 6. If a behavior is at baseline for a patient, e.g. the patient is normally confused, this will result in a score of 0. An increase in confusion will result in a score of 1.      Donna Kline is being assessed under the Broset Violence Checklist for any escalations in potentially violent or harmful behavior.     Date of Admission: 06/28/2020                                             Date of Assessment: 07/04/2020   Assessment Time 0800      Confused 0      Irritable 0      Boisterous 0      Verbal Threats 0      Physical Threats 0      Attacking Objects 0      SUM 0          TW attempted the following interventions based on the patient's sum total score assessed on the BVC: 1= Monitoring.      Should the patient require multiple interventions or fails to engage in de-escalation with staff, please review the plan for managing the care of an escalated patient.  Please do the following:  1.  Immediately notify Security and Nursing Administrative Supervisor/Director to respond  2.  Conduct team huddle to rule out clinical causes for escalated behavior  3.  Review the note authored by the Associate Professor for additional information  4.  Document specific behaviors observed  5.  Consider SAFE Team call for additional resources              The Broset Violence Checklist (BVC) is a 6 item inventory that was designed to assist in the prediction of imminent violent behavior (24 hours perspective) in healthcare and other sectors where workplace violence is acknowledged as a serious problem.   The BVC does not give instructions on what to do or what interventions to provide when a violent episode occurs; it is a took meant to aid in the risk assessment process. Interventions are based on the individual,  the environment, and cultural sensitivity indicators.

## 2020-07-04 NOTE — Progress Notes (Signed)
Checklist instructions: Score the patient once a shift and as needed. Absence of a behavior results in a score of 0. Presence or increase in the display of a behavior results in a score of 1. Maximum score (SUM) possible is 6. If a behavior is at baseline for a patient, e.g. the patient is normally confused, this will result in a score of 0. An increase in confusion will result in a score of 1.      Donna Kline is being assessed under the Broset Violence Checklist for any escalations in potentially violent or harmful behavior.     Date of Admission:  06/28/20    Date of Assessment: 07/04/20   Assessment Time 2045 2315     Confused 0 0     Irritable 0 1     Boisterous 0 1     Verbal Threats 0 0     Physical Threats 0 0     Attacking Objects 0 0     SUM 0 2         TW attempted the following interventions based on the patient's sum total score assessed on the BVC: 1= Monitoring.      Should the patient require multiple interventions or fails to engage in de-escalation with staff, please review the plan for managing the care of an escalated patient.  Please do the following:  1.  Immediately notify Security and Nursing Administrative Supervisor/Director to respond  2.  Conduct team huddle to rule out clinical causes for escalated behavior  3.  Review the note authored by the Associate Professor for additional information  4.  Document specific behaviors observed  5.  Consider SAFE Team call for additional resources              The Broset Violence Checklist (BVC) is a 6 item inventory that was designed to assist in the prediction of imminent violent behavior (24 hours perspective) in healthcare and other sectors where workplace violence is acknowledged as a serious problem.   The BVC does not give instructions on what to do or what interventions to provide when a violent episode occurs; it is a took meant to aid in the risk assessment process. Interventions are based on the individual, the environment, and cultural  sensitivity indicators.

## 2020-07-04 NOTE — Treatment Plan (Signed)
Interdisciplinary Treatment Plan Update Meeting    07/04/2020  Donna Kline    Participants:  Patient:  Donna Kline  Attending Physician:  Lenor Derrick NP  RN: Sedalia Muta RN    Short Term Goal:  Manage anxiety    Long Term Goal:   Discharge home and be in contact with occupational services. 2    Objective:  Review response to treatment, reassess needs/goals, update plan as indicated incorporating patient's strengths and stated needs, goals, and preferences.  [name] is able to address her needs and concerns. She was able to participate in her ITP and list short and long term goals for discharge. She is able to verbalize triggers and protective strategies.     1. Summary of Patient Progress on Treatment Plan Goals:  [name] is an active participant in her treatment team. She has been progressing appropriately towards discharge.     2. Level of Patient Involvement:  Actively engaged/contributing    3. Patient Understanding of Plan of Care:  Able to verbalize goals and interventions    4. Level of Agreement/Commitment to Plan of Care:  Agrees with and is committed to plan of care          Contributor Signatures:      MD_________________________________ Date___________________    SW_________________________________Date ___________________    RN _________________________________Date____________________    Patient_______________________________Date____________________    Other________________________________Date ___________________    Other________________________________Date ___________________    (This document is signed electronically by Clinical research associate and electronic co-signer.  Other participants sign a printed copy which is scanned into the EMR)
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
## 2020-07-04 NOTE — Progress Notes (Signed)
Patient has attended 1 group so far today. Pt was alternately pleasant and irritable during Wellness Group, hyperverbal with disorganized thinking and loose associations. She actively engaged in group exercise. Pt described her skills in singing, dancing, acting, and producing and famous friends who help her. Pt says that she is not looking for fame but rather space to create.  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      Will continue to monitor, assess and encourage group attendance.
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
## 2020-07-05 DIAGNOSIS — F17200 Nicotine dependence, unspecified, uncomplicated: Secondary | ICD-10-CM | POA: Insufficient documentation

## 2020-07-05 MED ORDER — RISPERIDONE 2 MG PO TABS
2.0000 mg | ORAL_TABLET | Freq: Two times a day (BID) | ORAL | 0 refills | Status: AC
Start: 2020-07-05 — End: 2020-07-19

## 2020-07-05 MED ORDER — RISPERIDONE 1 MG PO TABS
2.0000 mg | ORAL_TABLET | Freq: Two times a day (BID) | ORAL | Status: DC
Start: 2020-07-05 — End: 2020-07-05

## 2020-07-05 MED ORDER — AMLODIPINE BESYLATE 10 MG PO TABS
10.0000 mg | ORAL_TABLET | Freq: Every day | ORAL | 0 refills | Status: AC
Start: 2020-07-06 — End: 2020-07-20

## 2020-07-05 MED ORDER — NICOTINE 14 MG/24HR TD PT24
1.0000 | MEDICATED_PATCH | Freq: Every day | TRANSDERMAL | 0 refills | Status: AC
Start: 2020-07-06 — End: 2020-07-20

## 2020-07-05 NOTE — Discharge Instr - AVS First Page (Addendum)
[facility] Continuing Care Plan, including the After Visit Summary (AVS) and the Physicians Discharge Summary were faxed to [facility] on 07/05/2020 at 5 pm. Fax number is [phone].    RECOMMENDED THAT YOU COMPLY WITH THE FOLLOWING PLAN:   Follow up appointment with: Pt. Needs to follow up with Colon Flattery, RN CM at [facility] and make a psychiatry and therapy appt.           Location:   [address], [place], [place] [address]  P: [phone]  F: [phone]    RECOMMENDED THAT YOU COMPLY WITH THE FOLLOWING PLAN:     Contact [facility]-   [address]  [address]  [place], [place] [address]  [phone]     FOR EMERGENCY MENTAL HEALTH or Substance Abuse Appointment, CONTACT [facility] at [phone] or your local community services board emergency mental health/substance abuse at: [phone]      [facility] Suicide Prevention Lifeline:  3311381242    TOBACCO CESSATION DISCHARGE REFERRAL     QUIT NOW [phone].) Pt. Declined.

## 2020-07-05 NOTE — Progress Notes (Signed)
Checklist instructions: Score the patient once a shift and as needed. Absence of a behavior results in a score of 0. Presence or increase in the display of a behavior results in a score of 1. Maximum score (SUM) possible is 6. If a behavior is at baseline for a patient, e.g. the patient is normally confused, this will result in a score of 0. An increase in confusion will result in a score of 1.      Donna Kline is being assessed under the Broset Violence Checklist for any escalations in potentially violent or harmful behavior.     Date of Admission:   06/28/2020                                             Date of Assessment: 07/05/2020   Assessment Time 0800      Confused 0      Irritable 0      Boisterous 0      Verbal Threats 0      Physical Threats 0      Attacking Objects 0      SUM 0          TW attempted the following interventions based on the patient's sum total score assessed on the BVC: 1= Monitoring.      Should the patient require multiple interventions or fails to engage in de-escalation with staff, please review the plan for managing the care of an escalated patient.  Please do the following:  1.  Immediately notify Security and Nursing Administrative Supervisor/Director to respond  2.  Conduct team huddle to rule out clinical causes for escalated behavior  3.  Review the note authored by the Associate Professor for additional information  4.  Document specific behaviors observed  5.  Consider SAFE Team call for additional resources              The Broset Violence Checklist (BVC) is a 6 item inventory that was designed to assist in the prediction of imminent violent behavior (24 hours perspective) in healthcare and other sectors where workplace violence is acknowledged as a serious problem.   The BVC does not give instructions on what to do or what interventions to provide when a violent episode occurs; it is a took meant to aid in the risk assessment process. Interventions are based on the individual,  the environment, and cultural sensitivity indicators.

## 2020-07-05 NOTE — Progress Notes (Signed)
Checklist instructions: Score the patient once a shift and as needed. Absence of a behavior results in a score of 0. Presence or increase in the display of a behavior results in a score of 1. Maximum score (SUM) possible is 6. If a behavior is at baseline for a patient, e.g. the patient is normally confused, this will result in a score of 0. An increase in confusion will result in a score of 1.      Donna Kline is being assessed under the Broset Violence Checklist for any escalations in potentially violent or harmful behavior.     Date of Admission:     06/28/20                                                                    Date of Assessment: 07/04/20   Assessment Time 2000      Confused 0      Irritable 1      Boisterous 0      Verbal Threats 0      Physical Threats 0      Attacking Objects 0      SUM 1          TW attempted the following interventions based on the patient's sum total score assessed on the BVC: 1= Monitoring.      Should the patient require multiple interventions or fails to engage in de-escalation with staff, please review the plan for managing the care of an escalated patient.  Please do the following:  1.  Immediately notify Security and Nursing Administrative Supervisor/Director to respond  2.  Conduct team huddle to rule out clinical causes for escalated behavior  3.  Review the note authored by the Associate Professor for additional information  4.  Document specific behaviors observed  5.  Consider SAFE Team call for additional resources          The Broset Violence Checklist (BVC) is a 6 item inventory that was designed to assist in the prediction of imminent violent behavior (24 hours perspective) in healthcare and other sectors where workplace violence is acknowledged as a serious problem.   The BVC does not give instructions on what to do or what interventions to provide when a violent episode occurs; it is a took meant to aid in the risk assessment process. Interventions are based on  the individual, the environment, and cultural sensitivity indicators.

## 2020-07-05 NOTE — Progress Notes (Signed)
Patient fell asleep at about 3am. She has been observed resting in bed with eyes closed the rest of the night, no distress noted. Appeared sleeping for approximately 4hrs.
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
## 2020-07-05 NOTE — Discharge Summary -  Nursing (Signed)
[name] received discharge order.  Status upon discharge : She was A&Ox4 calm, and pleasant. She completed discharge paperwork appropriately and denied all psych symptoms.    Safety plan was  completed and a copy given to patient.    After Visit Summary, AVS, including prescribed medications, were reviewed with patient and patient was able to verbalize understanding of follow-up plan.  Patient was given a copy of their AVS and Adult Wellness, Recovery & Safety Plan.    Donna Kline was discharged to Home   Accompanied by alone     Mode of transportation: Taxi    Personal belongings were returned
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
## 2020-07-05 NOTE — Progress Notes (Signed)
RECREATION THERAPY DISCHARGE SUMMARY:     Patient goal: Prior to d/c, pt will ID min. 5 leisure coping skills as evidence by completing Leisure Coping Skills Plan to increase leisure awareness and managing emotions after d/c. - Met      Discharge Summary: Pt will be discharging today, 1/19. Pt displayed a high level of participation (attending <75%) in RT groups. Pt was able to meet RT goal by consistently attending groups and by completing a Leisure Coping Plan. Pt was able to identify several (5+) leisure activities to engage in when experiencing different types of emotions. Pt was also able to identify current supports (people to call and places to go) as well as community resources for leisure/recreation. A few leisure activities which pt included on plan include: singing, writing in journal, dancing, watching a motivational film, meditate, and going out with friends. RT met with pt today, 1/19 to provide pt with leisure resources- per pt request. RT provided pt with information on hot yoga classes as well as painting classes offered in [place], [place]. Pt was encouraged to utilize the plan after d/c as a tool to help cope and manage emotions.  Pt will be discharged from RT services.

## 2020-07-05 NOTE — Progress Notes (Addendum)
Worker received a VM from Dr. Loura Back at the [employer] from the [facility] at [phone] and the [facility] [place] is following the pt, but the [facility] is providing MH support, he asked which center for this worker to contact.     Worker spoke to the pt. Today about the above information and she says she wants this worker to follow up with the [facility].     Worker left a VM for Dr. Loura Back on his cell at [phone] to find out who the contact is for the [facility] center, worker asked for a return call.     Worker spoke to the [employer] at [phone] ext. [phone] and they transferred this worker to Colon Flattery, RN CM, at [phone]. Worker left a VM for [employer] and asked for a return call to set up outpt. Therapy and psychiatry appt's.     Worker left a VM with Colon Flattery, RN CM, at [phone] and asked for a return call.     D/c order was written and Worker didn't hear back from [employer]. Worker spoke to the pt. And she will follow up with Ms. White at the [facility].     Pt. Needs a cab to take her to her home.     AVS is completed.

## 2020-07-06 ENCOUNTER — Telehealth (INDEPENDENT_AMBULATORY_CARE_PROVIDER_SITE_OTHER): Payer: Self-pay

## 2020-07-06 NOTE — Telephone Encounter (Signed)
Hospital D/C Outreach    Called patient to review hospital discharge and discuss follow up arrangements, but no answer at number listed. Unable to leave voice mail due to no voice mail option.      Cadence Minton, RN  Nurse Navigator  [facility] Management Team  [phone]

## 2020-07-07 ENCOUNTER — Telehealth (INDEPENDENT_AMBULATORY_CARE_PROVIDER_SITE_OTHER): Payer: Self-pay

## 2020-07-07 NOTE — Telephone Encounter (Signed)
Hospital D/C Outreach    Called patient to review hospital discharge and discuss follow up arrangements, but no answer at number listed. Unable to leave voice mail message due to no voice mail box option. This is the second attempt at outreach to the patient.    Heron Sabins, RN  Nurse Navigator  [facility] Management Team  [phone]

## 2020-07-10 NOTE — Progress Notes (Signed)
Worker faxed the AVS and D/c summary to [place], [employer] [place], to [phone].

## 2020-07-12 NOTE — UM Notes (Signed)
DISCHARGE CLINICAL    Phone [phone]  Admission date:06/28/2020  D/C date:  07/05/2020  Number of Days Authorized: 7      Reference number: [id number]  Psychiatric Diagnosis:  Schizoaffective Type Bipolar Type: F25.1  Cannabis Use Disorder F12.10  Alcohol Use Disorder F10.20    Medications at the time of discharge:   Risperdal 2 mg PO BID    After care appointment:        RECOMMENDED THAT YOU COMPLY WITH THE FOLLOWING PLAN:   Follow up appointment with: Pt. Needs to follow up with Colon Flattery, RN CM at [facility] and make a psychiatry and therapy appt.           Location:   [address] [place], [place] [address]  P: [phone]  F: [phone]    RECOMMENDED THAT YOU COMPLY WITH THE FOLLOWING PLAN:     Contact [facility] Call [facility]-   [address]  [address]  [place], [place] [address]  [phone]        Danie Chandler, BSN   RN II Case Manager (Utilization Management)   [facility]   [phone]
